# Patient Record
Sex: Female | Born: 1965
Health system: Southern US, Community
[De-identification: ages and names within clinical notes are randomized; demographics above are authoritative.]

---

## 1998-11-01 ENCOUNTER — Ambulatory Visit (HOSPITAL_COMMUNITY): Admission: RE | Admit: 1998-11-01 | Discharge: 1998-11-01 | Payer: Self-pay | Admitting: *Deleted

## 1998-11-06 ENCOUNTER — Inpatient Hospital Stay (HOSPITAL_COMMUNITY): Admission: AD | Admit: 1998-11-06 | Discharge: 1998-11-06 | Payer: Self-pay | Admitting: *Deleted

## 1998-11-21 ENCOUNTER — Emergency Department (HOSPITAL_COMMUNITY): Admission: EM | Admit: 1998-11-21 | Discharge: 1998-11-21 | Payer: Self-pay | Admitting: Internal Medicine

## 1999-01-30 ENCOUNTER — Inpatient Hospital Stay (HOSPITAL_COMMUNITY): Admission: AD | Admit: 1999-01-30 | Discharge: 1999-01-30 | Payer: Self-pay | Admitting: Obstetrics & Gynecology

## 1999-02-04 ENCOUNTER — Encounter (HOSPITAL_COMMUNITY): Admission: RE | Admit: 1999-02-04 | Discharge: 1999-02-11 | Payer: Self-pay | Admitting: *Deleted

## 1999-02-04 ENCOUNTER — Encounter: Payer: Self-pay | Admitting: *Deleted

## 1999-02-07 ENCOUNTER — Inpatient Hospital Stay (HOSPITAL_COMMUNITY): Admission: AD | Admit: 1999-02-07 | Discharge: 1999-02-09 | Payer: Self-pay | Admitting: Obstetrics & Gynecology

## 2001-05-12 ENCOUNTER — Emergency Department (HOSPITAL_COMMUNITY): Admission: EM | Admit: 2001-05-12 | Discharge: 2001-05-12 | Payer: Self-pay | Admitting: Emergency Medicine

## 2002-04-07 ENCOUNTER — Emergency Department (HOSPITAL_COMMUNITY): Admission: EM | Admit: 2002-04-07 | Discharge: 2002-04-07 | Payer: Self-pay | Admitting: Emergency Medicine

## 2003-05-12 ENCOUNTER — Encounter: Payer: Self-pay | Admitting: Emergency Medicine

## 2003-05-12 ENCOUNTER — Emergency Department (HOSPITAL_COMMUNITY): Admission: EM | Admit: 2003-05-12 | Discharge: 2003-05-12 | Payer: Self-pay | Admitting: Emergency Medicine

## 2003-08-23 ENCOUNTER — Encounter: Payer: Self-pay | Admitting: Internal Medicine

## 2003-08-23 ENCOUNTER — Ambulatory Visit (HOSPITAL_COMMUNITY): Admission: RE | Admit: 2003-08-23 | Discharge: 2003-08-23 | Payer: Self-pay | Admitting: Internal Medicine

## 2005-06-23 ENCOUNTER — Ambulatory Visit: Payer: Self-pay | Admitting: Nurse Practitioner

## 2005-12-21 ENCOUNTER — Ambulatory Visit: Payer: Self-pay | Admitting: Nurse Practitioner

## 2007-04-19 ENCOUNTER — Ambulatory Visit: Payer: Self-pay | Admitting: Nurse Practitioner

## 2007-08-31 ENCOUNTER — Emergency Department (HOSPITAL_COMMUNITY): Admission: EM | Admit: 2007-08-31 | Discharge: 2007-08-31 | Payer: Self-pay | Admitting: Emergency Medicine

## 2009-07-18 ENCOUNTER — Emergency Department (HOSPITAL_COMMUNITY): Admission: EM | Admit: 2009-07-18 | Discharge: 2009-07-18 | Payer: Self-pay | Admitting: Emergency Medicine

## 2010-08-07 ENCOUNTER — Emergency Department (HOSPITAL_COMMUNITY): Admission: EM | Admit: 2010-08-07 | Discharge: 2010-08-07 | Payer: Self-pay | Admitting: Emergency Medicine

## 2011-08-26 LAB — URINALYSIS, ROUTINE W REFLEX MICROSCOPIC
Bilirubin Urine: NEGATIVE
Glucose, UA: NEGATIVE
Hgb urine dipstick: NEGATIVE
Nitrite: NEGATIVE
Specific Gravity, Urine: 1.037 — ABNORMAL HIGH
pH: 6

## 2017-03-05 ENCOUNTER — Encounter (HOSPITAL_COMMUNITY): Payer: Self-pay | Admitting: Emergency Medicine

## 2017-03-05 ENCOUNTER — Ambulatory Visit (HOSPITAL_COMMUNITY)
Admission: EM | Admit: 2017-03-05 | Discharge: 2017-03-05 | Disposition: A | Discharge status: Home / Self Care | Payer: 59 | Attending: Internal Medicine | Admitting: Internal Medicine

## 2017-03-05 DIAGNOSIS — R0789 Other chest pain: Secondary | ICD-10-CM | POA: Diagnosis not present

## 2017-03-05 DIAGNOSIS — M779 Enthesopathy, unspecified: Secondary | ICD-10-CM

## 2017-03-05 DIAGNOSIS — M778 Other enthesopathies, not elsewhere classified: Secondary | ICD-10-CM

## 2017-03-05 DIAGNOSIS — M940 Chondrocostal junction syndrome [Tietze]: Secondary | ICD-10-CM

## 2017-03-05 MED ORDER — TRIAMCINOLONE ACETONIDE 40 MG/ML IJ SUSP
40.0000 mg | Freq: Once | INTRAMUSCULAR | Status: AC
  Administered 2017-03-05: 40 mg via INTRAMUSCULAR

## 2017-03-05 MED ORDER — NAPROXEN 250 MG PO TABS: 250.0000 mg | ORAL_TABLET | Freq: Two times a day (BID) | ORAL | 0 refills | Status: AC

## 2017-03-05 MED ORDER — TRIAMCINOLONE ACETONIDE 40 MG/ML IJ SUSP
INTRAMUSCULAR | Status: AC
  Filled 2017-03-05: qty 1

## 2017-03-05 NOTE — ED Provider Notes (Signed)
CSN: [id number]     Arrival date & time 03/05/17  1719 History   First MD Initiated Contact with Patient 03/05/17 1821     Chief Complaint  Patient presents with  . Back Pain   (Consider location/radiation/quality/duration/timing/severity/associated sxs/prior Treatment) 51 year old female complaining of left lateral chest wall pain for 2 months. No history of trauma, fall or other injury. No shortness of breath, cough or fever. She states that it is painful to lay on the left side of her someone to push on the left side.  Also complains of pain to the right middle finger MCP. Primarily tenderness. 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 Denies any known injury.      History reviewed. No pertinent past medical history. History reviewed. No pertinent surgical history. History reviewed. No pertinent family history. Social History  Substance Use Topics  . Smoking status: Never Smoker  . Smokeless tobacco: Never Used  . Alcohol use No   OB History    No data available     Review of Systems  Constitutional: Negative for activity change, fatigue and fever.  HENT: Negative.   Respiratory: Negative for chest tightness, shortness of breath and wheezing.   Cardiovascular: Negative.   Gastrointestinal: Negative.   Musculoskeletal:       As per history of present illness  Skin: Negative.   Neurological: Negative.   All other systems reviewed and are negative.   Allergies  Patient has no known allergies.  Home Medications   Prior to Admission medications   Not on File   Meds Ordered and Administered this Visit   Medications  triamcinolone acetonide (KENALOG-40) injection 40 mg (not administered)    BP (!) 120/108 (BP Location: Left Arm)   Pulse 93   Temp 98.4 F (36.9 C) (Oral)   Resp 18   SpO2 98%  No data found.   Physical Exam  Constitutional: She appears well-developed and well-nourished. No distress.  Eyes: EOM are normal.   Neck: Normal range of motion. Neck supple.  Cardiovascular: Normal rate, regular rhythm, normal heart sounds and intact distal pulses.   Pulmonary/Chest: Effort normal and breath sounds normal. No respiratory distress. She has no wheezes. She has no rales. She exhibits tenderness.  Left chest wall along the mid axillary line there is tenderness from  chest inferior to the axilla to the left lateral costal margin. Tenderness to the ribs and intercostal muscles. Posterior chest wall or back is not tender. No anterior chest wall tenderness or pain.   Abdominal: Soft. She exhibits no mass. There is no tenderness. There is no guarding.  Musculoskeletal: Normal range of motion. She exhibits no deformity.  Tenderness to the right third MCP palmar aspect. No palpable nodules or thickening. Full range of motion of the digits extension and flexion.  Lymphadenopathy:    She has no cervical adenopathy.  Neurological: She is alert. No cranial nerve deficit.  Skin: Skin is warm and dry.  Psychiatric: She has a normal mood and affect. Her behavior is normal.  Nursing note and vitals reviewed.   Urgent Care Course     Procedures (including critical care time)  Labs Review Labs Reviewed - No data to display  Imaging Review No results found.   Visual Acuity Review  Right Eye Distance:   Left Eye Distance:   Bilateral Distance:    Right Eye Near:   Left Eye Near:    Bilateral Near:  MDM   1. Costochondritis   2. Chest wall pain    Place cold packs or ice packs over the left side of your chest. Do this intermittently. Also take the new medicine to help with inflammation and pain. If you develop cough, fever, shortness of breath, worsening or new symptoms go to the emergency department. Otherwise follow-up with your primary care provider. Your blood pressure is elevated and you will need additional evaluation. Meds ordered this encounter  Medications  . triamcinolone acetonide  (KENALOG-40) injection 40 mg  . naproxen (NAPROSYN) 250 MG tablet    Sig: Take 1 tablet (250 mg total) by mouth 2 (two) times daily with a meal.    Dispense:  20 tablet    Refill:  0    Order Specific Question:   Supervising Provider    Answer:   Eustace Moore [[id number]]  finger splint right middle finger     Hayden Rasmussen, NP 03/05/17 1843    Hayden Rasmussen, NP 03/05/17 1915    Hayden Rasmussen, NP 03/05/17 1916

## 2017-03-05 NOTE — Discharge Instructions (Addendum)
Place cold packs or ice packs over the left side of your chest. Do this intermittently. Also take the new medicine to help with inflammation and pain. If you develop cough, fever, shortness of breath, worsening or new symptoms go to the emergency department. Otherwise follow-up with your primary care provider. Your blood pressure is elevated and you will need additional evaluation.

## 2017-03-05 NOTE — ED Triage Notes (Signed)
The patient presented to the [facility] with a complaint of left side back and rib pain x 2 months.

## 2017-03-16 DIAGNOSIS — H5203 Hypermetropia, bilateral: Secondary | ICD-10-CM | POA: Diagnosis not present

## 2017-03-16 DIAGNOSIS — H524 Presbyopia: Secondary | ICD-10-CM | POA: Diagnosis not present

## 2017-03-16 DIAGNOSIS — H52203 Unspecified astigmatism, bilateral: Secondary | ICD-10-CM | POA: Diagnosis not present

## 2017-03-24 DIAGNOSIS — E784 Other hyperlipidemia: Secondary | ICD-10-CM | POA: Diagnosis not present

## 2017-03-24 DIAGNOSIS — Z131 Encounter for screening for diabetes mellitus: Secondary | ICD-10-CM | POA: Diagnosis not present

## 2017-03-24 DIAGNOSIS — K219 Gastro-esophageal reflux disease without esophagitis: Secondary | ICD-10-CM | POA: Diagnosis not present

## 2017-03-24 DIAGNOSIS — K5900 Constipation, unspecified: Secondary | ICD-10-CM | POA: Diagnosis not present

## 2017-03-24 DIAGNOSIS — J302 Other seasonal allergic rhinitis: Secondary | ICD-10-CM | POA: Diagnosis not present

## 2017-03-24 DIAGNOSIS — M545 Low back pain: Secondary | ICD-10-CM | POA: Diagnosis not present

## 2017-06-30 DIAGNOSIS — M79642 Pain in left hand: Secondary | ICD-10-CM | POA: Diagnosis not present

## 2017-06-30 DIAGNOSIS — M25542 Pain in joints of left hand: Secondary | ICD-10-CM | POA: Diagnosis not present

## 2017-10-21 DIAGNOSIS — E7849 Other hyperlipidemia: Secondary | ICD-10-CM | POA: Diagnosis not present

## 2017-10-21 DIAGNOSIS — E559 Vitamin D deficiency, unspecified: Secondary | ICD-10-CM | POA: Diagnosis not present

## 2017-10-21 DIAGNOSIS — E2839 Other primary ovarian failure: Secondary | ICD-10-CM | POA: Diagnosis not present

## 2017-10-21 DIAGNOSIS — J209 Acute bronchitis, unspecified: Secondary | ICD-10-CM | POA: Diagnosis not present

## 2017-10-21 DIAGNOSIS — M545 Low back pain: Secondary | ICD-10-CM | POA: Diagnosis not present

## 2017-10-21 DIAGNOSIS — Z1239 Encounter for other screening for malignant neoplasm of breast: Secondary | ICD-10-CM | POA: Diagnosis not present

## 2017-10-21 DIAGNOSIS — J019 Acute sinusitis, unspecified: Secondary | ICD-10-CM | POA: Diagnosis not present

## 2017-10-28 ENCOUNTER — Other Ambulatory Visit: Payer: Self-pay | Admitting: Internal Medicine

## 2017-10-28 DIAGNOSIS — E2839 Other primary ovarian failure: Secondary | ICD-10-CM

## 2017-11-04 ENCOUNTER — Ambulatory Visit
Admission: RE | Admit: 2017-11-04 | Discharge: 2017-11-04 | Disposition: A | Discharge status: Home / Self Care | Payer: 59 | Source: Ambulatory Visit | Attending: Internal Medicine | Admitting: Internal Medicine

## 2017-11-04 DIAGNOSIS — M8588 Other specified disorders of bone density and structure, other site: Secondary | ICD-10-CM | POA: Diagnosis not present

## 2017-11-04 DIAGNOSIS — Z780 Asymptomatic menopausal state: Secondary | ICD-10-CM | POA: Diagnosis not present

## 2017-11-04 DIAGNOSIS — E2839 Other primary ovarian failure: Secondary | ICD-10-CM

## 2017-11-18 DIAGNOSIS — M899 Disorder of bone, unspecified: Secondary | ICD-10-CM | POA: Diagnosis not present

## 2017-11-18 DIAGNOSIS — E7849 Other hyperlipidemia: Secondary | ICD-10-CM | POA: Diagnosis not present

## 2017-11-18 DIAGNOSIS — Z131 Encounter for screening for diabetes mellitus: Secondary | ICD-10-CM | POA: Diagnosis not present

## 2017-11-18 DIAGNOSIS — H6122 Impacted cerumen, left ear: Secondary | ICD-10-CM | POA: Diagnosis not present

## 2017-11-18 DIAGNOSIS — R42 Dizziness and giddiness: Secondary | ICD-10-CM | POA: Diagnosis not present

## 2017-11-18 DIAGNOSIS — K219 Gastro-esophageal reflux disease without esophagitis: Secondary | ICD-10-CM | POA: Diagnosis not present

## 2017-12-21 DIAGNOSIS — K219 Gastro-esophageal reflux disease without esophagitis: Secondary | ICD-10-CM | POA: Diagnosis not present

## 2017-12-21 DIAGNOSIS — J302 Other seasonal allergic rhinitis: Secondary | ICD-10-CM | POA: Diagnosis not present

## 2017-12-21 DIAGNOSIS — E7849 Other hyperlipidemia: Secondary | ICD-10-CM | POA: Diagnosis not present

## 2018-02-10 ENCOUNTER — Other Ambulatory Visit: Payer: Self-pay

## 2018-02-10 ENCOUNTER — Encounter (HOSPITAL_COMMUNITY): Payer: Self-pay | Admitting: Emergency Medicine

## 2018-02-10 DIAGNOSIS — R1111 Vomiting without nausea: Secondary | ICD-10-CM | POA: Diagnosis not present

## 2018-02-10 DIAGNOSIS — R1013 Epigastric pain: Secondary | ICD-10-CM | POA: Diagnosis not present

## 2018-02-10 DIAGNOSIS — R1031 Right lower quadrant pain: Secondary | ICD-10-CM | POA: Insufficient documentation

## 2018-02-10 DIAGNOSIS — R112 Nausea with vomiting, unspecified: Secondary | ICD-10-CM | POA: Diagnosis not present

## 2018-02-10 DIAGNOSIS — Z79899 Other long term (current) drug therapy: Secondary | ICD-10-CM | POA: Insufficient documentation

## 2018-02-10 LAB — I-STAT TROPONIN, ED: Troponin i, poc: 0 ng/mL (ref 0.00–0.08)

## 2018-02-10 LAB — I-STAT BETA HCG BLOOD, ED (MC, WL, AP ONLY): I-stat hCG, quantitative: <5 m[IU]/mL (ref <5)

## 2018-02-10 NOTE — ED Triage Notes (Signed)
Pt c/o epigastric pain, nausea/vomiting/diarrhea that started at noon today. Denies shortness of breath or urinary symptoms.

## 2018-02-11 ENCOUNTER — Emergency Department (HOSPITAL_COMMUNITY): Payer: 59

## 2018-02-11 ENCOUNTER — Emergency Department (HOSPITAL_COMMUNITY)
Admission: EM | Admit: 2018-02-11 | Discharge: 2018-02-11 | Disposition: A | Discharge status: Home / Self Care | Payer: 59 | Attending: Emergency Medicine | Admitting: Emergency Medicine

## 2018-02-11 DIAGNOSIS — R1110 Vomiting, unspecified: Secondary | ICD-10-CM | POA: Diagnosis not present

## 2018-02-11 DIAGNOSIS — R1013 Epigastric pain: Secondary | ICD-10-CM

## 2018-02-11 DIAGNOSIS — R112 Nausea with vomiting, unspecified: Secondary | ICD-10-CM | POA: Diagnosis not present

## 2018-02-11 DIAGNOSIS — R109 Unspecified abdominal pain: Secondary | ICD-10-CM | POA: Diagnosis not present

## 2018-02-11 DIAGNOSIS — R1111 Vomiting without nausea: Secondary | ICD-10-CM | POA: Diagnosis not present

## 2018-02-11 DIAGNOSIS — Z79899 Other long term (current) drug therapy: Secondary | ICD-10-CM | POA: Diagnosis not present

## 2018-02-11 DIAGNOSIS — R1031 Right lower quadrant pain: Secondary | ICD-10-CM | POA: Diagnosis not present

## 2018-02-11 LAB — CBC
HCT: 41.1 % (ref 36.0–46.0)
Hemoglobin: 13.5 g/dL (ref 12.0–15.0)
MCH: 28 pg (ref 26.0–34.0)
MCHC: 32.8 g/dL (ref 30.0–36.0)
MCV: 85.3 fL (ref 78.0–100.0)
PLATELETS: 308 10*3/uL (ref 150–400)
RBC: 4.82 MIL/uL (ref 3.87–5.11)
RDW: 13.6 % (ref 11.5–15.5)
WBC: 12.8 10*3/uL — ABNORMAL HIGH (ref 4.0–10.5)

## 2018-02-11 LAB — LIPASE, BLOOD: LIPASE: 24 U/L (ref 11–51)

## 2018-02-11 LAB — COMPREHENSIVE METABOLIC PANEL
ALBUMIN: 4.4 g/dL (ref 3.5–5.0)
ALT: 48 U/L (ref 14–54)
AST: 40 U/L (ref 15–41)
Alkaline Phosphatase: 94 U/L (ref 38–126)
Anion gap: 11 (ref 5–15)
BUN: 12 mg/dL (ref 6–20)
CHLORIDE: 103 mmol/L (ref 101–111)
CO2: 21 mmol/L — AB (ref 22–32)
CREATININE: 0.62 mg/dL (ref 0.44–1.00)
Calcium: 9.9 mg/dL (ref 8.9–10.3)
GFR calc Af Amer: >60 mL/min (ref >60)
GFR calc non Af Amer: >60 mL/min (ref >60)
Glucose, Bld: 151 mg/dL — ABNORMAL HIGH (ref 65–99)
POTASSIUM: 3.9 mmol/L (ref 3.5–5.1)
SODIUM: 135 mmol/L (ref 135–145)
Total Bilirubin: 0.7 mg/dL (ref 0.3–1.2)
Total Protein: 8.5 g/dL — ABNORMAL HIGH (ref 6.5–8.1)

## 2018-02-11 LAB — URINALYSIS, ROUTINE W REFLEX MICROSCOPIC
BILIRUBIN URINE: NEGATIVE
Glucose, UA: NEGATIVE mg/dL
HGB URINE DIPSTICK: NEGATIVE
Ketones, ur: NEGATIVE mg/dL
Leukocytes, UA: NEGATIVE
Nitrite: NEGATIVE
PH: 5 (ref 5.0–8.0)
Protein, ur: NEGATIVE mg/dL

## 2018-02-11 MED ORDER — SODIUM CHLORIDE 0.9 % IV BOLUS
1000.0000 mL | Freq: Once | INTRAVENOUS | Status: AC
  Administered 2018-02-11: 1000 mL via INTRAVENOUS

## 2018-02-11 MED ORDER — MORPHINE SULFATE (PF) 4 MG/ML IV SOLN
4.0000 mg | Freq: Once | INTRAVENOUS | Status: AC
  Administered 2018-02-11: 4 mg via INTRAVENOUS
  Filled 2018-02-11: qty 1

## 2018-02-11 MED ORDER — ONDANSETRON HCL 4 MG/2ML IJ SOLN
4.0000 mg | Freq: Once | INTRAMUSCULAR | Status: AC
  Administered 2018-02-11: 4 mg via INTRAVENOUS
  Filled 2018-02-11: qty 2

## 2018-02-11 MED ORDER — ONDANSETRON HCL 4 MG PO TABS: 4.0000 mg | ORAL_TABLET | Freq: Three times a day (TID) | ORAL | 0 refills | Status: AC | PRN

## 2018-02-11 MED ORDER — IOPAMIDOL (ISOVUE-300) INJECTION 61%
INTRAVENOUS | Status: AC
  Administered 2018-02-11: 100 mL
  Filled 2018-02-11: qty 100

## 2018-02-11 NOTE — ED Notes (Signed)
Patient transported to CT 

## 2018-02-11 NOTE — ED Provider Notes (Signed)
[facility] EMERGENCY DEPARTMENT Provider Note   CSN: [id number] Arrival date & time: 02/10/18  2232     History   Chief Complaint Chief Complaint  Patient presents with  . Abdominal Pain    HPI Brenda Zimmerman is a 52 y.o. female.  Pt presents to the ED today with abdominal pain and n/v.  The pt said sx started last night.  She's been in the waiting room all night.  She said her pain is in her epigastrium and her lower abdomen.  Pt denies f/c.  No dysuria.     History reviewed. No pertinent past medical history.  There are no active problems to display for this patient.   History reviewed. No pertinent surgical history.   OB History   None      Home Medications    Prior to Admission medications   Medication Sig Start Date End Date Taking? Authorizing Provider  acetaminophen (TYLENOL) 325 MG tablet Take by mouth as needed for headache.   Yes [provider]  [employer] EAR DROPS 6.5 % OTIC solution Place 4 drops into both ears 2 (two) times daily. 11/18/17  Yes [provider]  [facility] OYSTER SHELL CALCIUM+VIT D 500-125 MG-UNIT TABS Take 1 tablet by mouth daily. 11/18/17  Yes [provider]  naproxen (NAPROSYN) 250 MG tablet Take 1 tablet (250 mg total) by mouth 2 (two) times daily with a meal. 03/05/17  Yes Mabe, David, NP  Vitamin D, Ergocalciferol, (DRISDOL) 50000 units CAPS capsule Take 50,000 Units by mouth once a week. 01/10/18  Yes [provider]  ondansetron (ZOFRAN) 4 MG tablet Take 1 tablet (4 mg total) by mouth every 8 (eight) hours as needed for nausea or vomiting. 02/11/18   Jacalyn Lefevre, MD    Family History No family history on file.  Social History Social History   Tobacco Use  . Smoking status: Never Smoker  . Smokeless tobacco: Never Used  Substance Use Topics  . Alcohol use: No  . Drug use: No     Allergies   Patient has no known allergies.   Review of Systems Review of Systems    Gastrointestinal: Positive for abdominal pain, nausea and vomiting.  All other systems reviewed and are negative.    Physical Exam Updated Vital Signs BP 119/80   Pulse (!) 108   Temp 98.7 F (37.1 C) (Oral)   Resp 18   SpO2 96%   Physical Exam  Constitutional: She is oriented to person, place, and time. She appears well-developed and well-nourished.  HENT:  Head: Normocephalic and atraumatic.  Mouth/Throat: Oropharynx is clear and moist.  Eyes: Pupils are equal, round, and reactive to light. EOM are normal.  Cardiovascular: Regular rhythm, normal heart sounds and intact distal pulses. Tachycardia present.  Pulmonary/Chest: Effort normal and breath sounds normal.  Abdominal: Normal appearance and bowel sounds are normal. There is tenderness in the right lower quadrant and epigastric area.  Neurological: She is alert and oriented to person, place, and time.  Skin: Skin is warm and dry. Capillary refill takes less than 2 seconds.  Psychiatric: She has a normal mood and affect. Her behavior is normal.  Nursing note and vitals reviewed.    ED Treatments / Results  Labs (all labs ordered are listed, but only abnormal results are displayed) Labs Reviewed  COMPREHENSIVE METABOLIC PANEL - Abnormal; Notable for the following components:      Result Value   CO2 21 (*)    Glucose,  Bld 151 (*)    Total Protein 8.5 (*)    All other components within normal limits  CBC - Abnormal; Notable for the following components:   WBC 12.8 (*)    All other components within normal limits  URINALYSIS, ROUTINE W REFLEX MICROSCOPIC - Abnormal; Notable for the following components:   Specific Gravity, Urine >1.046 (*)    All other components within normal limits  LIPASE, BLOOD  I-STAT BETA HCG BLOOD, ED ([facility], [facility], [facility] ONLY)  I-STAT TROPONIN, ED    EKG None  Radiology Ct Abdomen Pelvis W Contrast  Result Date: 02/11/2018 CLINICAL DATA:  Diffuse abdominal pain with nausea and vomiting. EXAM:  CT ABDOMEN AND PELVIS WITH CONTRAST TECHNIQUE: Multidetector CT imaging of the abdomen and pelvis was performed using the standard protocol following bolus administration of intravenous contrast. CONTRAST:  100mL ISOVUE-300 IOPAMIDOL (ISOVUE-300) INJECTION 61% COMPARISON:  None. FINDINGS: Evaluation of the upper abdomen is limited due to motion artifact. Lower chest: No acute abnormality. Hepatobiliary: No focal liver abnormality is seen. No gallstones, gallbladder wall thickening, or biliary dilatation. Pancreas: Unremarkable. No pancreatic ductal dilatation or surrounding inflammatory changes. Spleen: Normal in size without focal abnormality. Adrenals/Urinary Tract: Adrenal glands are unremarkable. Kidneys are normal, without renal calculi, focal lesion, or hydronephrosis. Bladder is unremarkable. Stomach/Bowel: Stomach is within normal limits. The appendix is visualized in the right lower quadrant just inferior to the cecum. There is mild mucosal hyperenhancement and fluid throughout the appendix. No appendicolith. The appendix is at the upper limits of normal in size, measuring 7 mm in maximal diameter. There are no significant surrounding inflammatory changes. No evidence of bowel wall thickening or distention. Vascular/Lymphatic: No significant vascular findings are present. No enlarged abdominal or pelvic lymph nodes. Reproductive: Uterus and bilateral adnexa are unremarkable. Other: No abdominal wall hernia or abnormality. No abdominopelvic ascites. No pneumoperitoneum. Musculoskeletal: No acute or significant osseous findings. IMPRESSION: 1. Mild mucosal enhancement of the fluid-filled appendix, at the upper limits of normal in diameter, measuring 7 mm. No prominent surrounding inflammatory changes. Early appendicitis is not excluded. Clinical correlation is recommended. Electronically Signed   By: Obie DredgeWilliam T Derry M.D.   On: 02/11/2018 11:10   [place] Abdomen Limited  Result Date: 02/11/2018 CLINICAL DATA:   Epigastric pain over the last day. EXAM: ULTRASOUND ABDOMEN LIMITED RIGHT UPPER QUADRANT COMPARISON:  CT same day FINDINGS: Gallbladder: No gallstones or wall thickening visualized. No sonographic Murphy sign noted by sonographer. Common bile duct: Diameter: 3 mm common normal Liver: No focal lesion identified. Within normal limits in parenchymal echogenicity. Portal vein is patent on color Doppler imaging with normal direction of blood flow towards the liver. IMPRESSION: Normal right upper quadrant ultrasound. Electronically Signed   By: Paulina FusiMark  Shogry M.D.   On: 02/11/2018 14:26    Procedures Procedures (including critical care time)  Medications Ordered in ED Medications  sodium chloride 0.9 % bolus 1,000 mL (0 mLs Intravenous Stopped 02/11/18 1255)  ondansetron (ZOFRAN) injection 4 mg (4 mg Intravenous Given 02/11/18 0916)  morphine 4 MG/ML injection 4 mg (4 mg Intravenous Given 02/11/18 0916)  iopamidol (ISOVUE-300) 61 % injection (100 mLs  Contrast Given 02/11/18 1018)     Initial Impression / Assessment and Plan / ED Course  I have reviewed the triage vital signs and the nursing notes.  Pertinent labs & imaging results that were available during my care of the patient were reviewed by me and considered in my medical decision making (see chart for details).  Pt is feeling much better.  She is able to tolerate po fluids.  Return if worse.  Final Clinical Impressions(s) / ED Diagnoses   Final diagnoses:  Epigastric pain  Non-intractable vomiting with nausea, unspecified vomiting type    ED Discharge Orders        Ordered    ondansetron (ZOFRAN) 4 MG tablet  Every 8 hours PRN     02/11/18 1439       Jacalyn Lefevre, MD 02/11/18 1439

## 2018-02-11 NOTE — Consult Note (Signed)
[facility] Consult Note  Brenda Zimmerman 02-Mar-1966  [id number].    Requesting MD: Isla Pence Chief Complaint/Reason for Consult: abdominal pain  HPI:  Brenda Zimmerman is a 52yo female who presented to the [facility] last night with acute onset epigastric abdominal pain. It does not radiate. Patient states that the pain started around 1600 yesterday. Associated with nausea, vomiting, diarrhea. Denies fever, chills, CP, SOB, dysuria.  States that she has never had pain like this before. Unsure if she has had any recent sick contacts, but she does work in the hospital. Last meal was yesterday at 1100. In the ED she had a CT scan which shows mild mucosal enhancement of the fluid-filled appendix, at the upper limits of normal in diameter measuring 7 mm, no prominent surrounding inflammatory changes. WBC 12.8, tachycardic at 108. LFTs and lipase WNL. General surgery asked to see.  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   HENT: Negative.   Eyes: Negative.   Respiratory: Negative.   Cardiovascular: Negative.   Gastrointestinal: Positive for abdominal pain, diarrhea, nausea and vomiting.  Genitourinary: Negative.   Musculoskeletal: Negative.   Skin: Negative.   Neurological: Negative.    All systems reviewed and otherwise negative except for as above  No family history on file.  History reviewed. No pertinent past medical history.  History reviewed. No pertinent surgical history.  Social History:  reports that she has never smoked. She has never used smokeless tobacco. She reports that she does not drink alcohol or use drugs.  Allergies: No Known Allergies   (Not in a hospital admission)  Prior to Admission medications   Medication Sig Start Date End Date Taking? Authorizing Provider  acetaminophen (TYLENOL) 325 MG  tablet Take by mouth as needed for headache.   Yes [provider]  [employer] EAR DROPS 6.5 % OTIC solution Place 4 drops into both ears 2 (two) times daily. 11/18/17  Yes [provider]  [facility]S OYSTER SHELL CALCIUM+VIT D 500-125 MG-UNIT TABS Take 1 tablet by mouth daily. 11/18/17  Yes [provider]  naproxen (NAPROSYN) 250 MG tablet Take 1 tablet (250 mg total) by mouth 2 (two) times daily with a meal. 03/05/17  Yes Mabe, David, NP  Vitamin D, Ergocalciferol, (DRISDOL) 50000 units CAPS capsule Take 50,000 Units by mouth once a week. 01/10/18  Yes [provider]    Blood pressure 119/80, pulse (!) 108, temperature 98.7 F (37.1 C), temperature source Oral, resp. rate 18, SpO2 96 %. Physical Exam: General: pleasant, WD/WN female who is laying in bed in NAD HEENT: head is normocephalic, atraumatic.  Sclera are noninjected.  Pupils equal and round.  Ears and nose without any masses or lesions.  Mouth is pink and moist. Dentition fair Heart: regular, rate, and rhythm.  No obvious murmurs, gallops, or rubs noted.  Palpable pedal pulses bilaterally Lungs: CTAB, no wheezes, rhonchi, or rales noted.  Respiratory effort nonlabored Abd: soft, ND, +BS, no masses, hernias, or organomegaly. +TTP epigastric region, no RLQ or LLQ tenderness MS: all 4 extremities are symmetrical with no cyanosis, clubbing, or edema. Skin: warm and dry with no masses, lesions, or rashes Psych: A&Ox3 with an appropriate affect. Neuro: cranial nerves grossly intact, extremity CSM intact bilaterally, normal speech  Results for orders placed or performed during the hospital encounter of 02/11/18 (from the past 48 hour(s))  Lipase, blood  Status: None   Collection Time: 02/10/18 11:04 PM  Result Value Ref Range   Lipase 24 11 - 51 U/L    Comment: Performed at [facility], [place] [address], [place], [place] [address]  Comprehensive metabolic panel     Status: Abnormal   Collection Time: 02/10/18  11:04 PM  Result Value Ref Range   Sodium 135 135 - 145 mmol/L   Potassium 3.9 3.5 - 5.1 mmol/L   Chloride 103 101 - 111 mmol/L   CO2 21 (L) 22 - 32 mmol/L   Glucose, Bld 151 (H) 65 - 99 mg/dL   BUN 12 6 - 20 mg/dL   Creatinine, Ser 0.62 0.44 - 1.00 mg/dL   Calcium 9.9 8.9 - 10.3 mg/dL   Total Protein 8.5 (H) 6.5 - 8.1 g/dL   Albumin 4.4 3.5 - 5.0 g/dL   AST 40 15 - 41 U/L   ALT 48 14 - 54 U/L   Alkaline Phosphatase 94 38 - 126 U/L   Total Bilirubin 0.7 0.3 - 1.2 mg/dL   GFR calc non Af Amer >60 >60 mL/min   GFR calc Af Amer >60 >60 mL/min    Comment: (NOTE) The eGFR has been calculated using the CKD EPI equation. This calculation has not been validated in all clinical situations. eGFR's persistently <60 mL/min signify possible Chronic Kidney Disease.    Anion gap 11 5 - 15    Comment: Performed at [facility] [address] [place], [place] [address]  CBC     Status: Abnormal   Collection Time: 02/10/18 11:04 PM  Result Value Ref Range   WBC 12.8 (H) 4.0 - 10.5 K/uL   RBC 4.82 3.87 - 5.11 MIL/uL   Hemoglobin 13.5 12.0 - 15.0 g/dL   HCT 41.1 36.0 - 46.0 %   MCV 85.3 78.0 - 100.0 fL   MCH 28.0 26.0 - 34.0 pg   MCHC 32.8 30.0 - 36.0 g/dL   RDW 13.6 11.5 - 15.5 %   Platelets 308 150 - 400 K/uL    Comment: Performed at [facility] [address], [place], [place] [address]  I-Stat beta hCG blood, ED     Status: None   Collection Time: 02/10/18 11:27 PM  Result Value Ref Range   I-stat hCG, quantitative <5.0 <5 mIU/mL   Comment 3            Comment:   GEST. AGE      CONC.  (mIU/mL)   <=1 WEEK        5 - 50     2 WEEKS       50 - 500     3 WEEKS       100 - 10,000     4 WEEKS     1,000 - 30,000        FEMALE AND NON-PREGNANT FEMALE:     LESS THAN 5 mIU/mL   I-stat troponin, ED     Status: None   Collection Time: 02/10/18 11:27 PM  Result Value Ref Range   Troponin i, poc 0.00 0.00 - 0.08 ng/mL   Comment 3            Comment: Due to the release  kinetics of cTnI, a negative result within the first hours of the onset of symptoms does not rule out myocardial infarction with certainty. If myocardial infarction is still suspected, repeat the test at appropriate intervals.    Ct  Abdomen Pelvis W Contrast  Result Date: 02/11/2018 CLINICAL DATA:  Diffuse abdominal pain with nausea and vomiting. EXAM: CT ABDOMEN AND PELVIS WITH CONTRAST TECHNIQUE: Multidetector CT imaging of the abdomen and pelvis was performed using the standard protocol following bolus administration of intravenous contrast. CONTRAST:  180m ISOVUE-300 IOPAMIDOL (ISOVUE-300) INJECTION 61% COMPARISON:  None. FINDINGS: Evaluation of the upper abdomen is limited due to motion artifact. Lower chest: No acute abnormality. Hepatobiliary: No focal liver abnormality is seen. No gallstones, gallbladder wall thickening, or biliary dilatation. Pancreas: Unremarkable. No pancreatic ductal dilatation or surrounding inflammatory changes. Spleen: Normal in size without focal abnormality. Adrenals/Urinary Tract: Adrenal glands are unremarkable. Kidneys are normal, without renal calculi, focal lesion, or hydronephrosis. Bladder is unremarkable. Stomach/Bowel: Stomach is within normal limits. The appendix is visualized in the right lower quadrant just inferior to the cecum. There is mild mucosal hyperenhancement and fluid throughout the appendix. No appendicolith. The appendix is at the upper limits of normal in size, measuring 7 mm in maximal diameter. There are no significant surrounding inflammatory changes. No evidence of bowel wall thickening or distention. Vascular/Lymphatic: No significant vascular findings are present. No enlarged abdominal or pelvic lymph nodes. Reproductive: Uterus and bilateral adnexa are unremarkable. Other: No abdominal wall hernia or abnormality. No abdominopelvic ascites. No pneumoperitoneum. Musculoskeletal: No acute or significant osseous findings. IMPRESSION: 1. Mild  mucosal enhancement of the fluid-filled appendix, at the upper limits of normal in diameter, measuring 7 mm. No prominent surrounding inflammatory changes. Early appendicitis is not excluded. Clinical correlation is recommended. Electronically Signed   By: WTitus DubinM.D.   On: 02/11/2018 11:10   Anti-infectives (From admission, onward)   None        Assessment/Plan Epigastric abdominal pain, nausea, vomiting, diarrhea - Patient with acute onset epigastric pain. CT scan with possible findings of early appendicitis, but she has no periumbilical or RLQ tenderness. WBC slightly elevated at 12.8 and she is mildly tachycardic. Lipase and LFTs WNL. Will check abdominal u/s to evaluate her gallbladder. Also included in the differential diagnosis is viral gastroenteritis. Will obtain u/s and discuss with MD.   BWellington Hampshire PChrist[facility] 02/11/2018, 1:05 PM Pager: [phone]: [phone]-Fri 7:00 am-4:30 pm Sat-Sun 7:00 am-11:30 am

## 2018-02-17 DIAGNOSIS — M545 Low back pain: Secondary | ICD-10-CM | POA: Diagnosis not present

## 2018-02-17 DIAGNOSIS — Z1211 Encounter for screening for malignant neoplasm of colon: Secondary | ICD-10-CM | POA: Diagnosis not present

## 2018-02-17 DIAGNOSIS — K219 Gastro-esophageal reflux disease without esophagitis: Secondary | ICD-10-CM | POA: Diagnosis not present

## 2018-02-17 DIAGNOSIS — J302 Other seasonal allergic rhinitis: Secondary | ICD-10-CM | POA: Diagnosis not present

## 2018-02-17 DIAGNOSIS — E7849 Other hyperlipidemia: Secondary | ICD-10-CM | POA: Diagnosis not present

## 2018-02-17 DIAGNOSIS — Z124 Encounter for screening for malignant neoplasm of cervix: Secondary | ICD-10-CM | POA: Diagnosis not present

## 2018-03-29 ENCOUNTER — Encounter: Payer: Self-pay | Admitting: Internal Medicine

## 2018-05-10 DIAGNOSIS — J302 Other seasonal allergic rhinitis: Secondary | ICD-10-CM | POA: Diagnosis not present

## 2018-05-10 DIAGNOSIS — E559 Vitamin D deficiency, unspecified: Secondary | ICD-10-CM | POA: Diagnosis not present

## 2018-05-10 DIAGNOSIS — L259 Unspecified contact dermatitis, unspecified cause: Secondary | ICD-10-CM | POA: Diagnosis not present

## 2018-05-10 DIAGNOSIS — Z1211 Encounter for screening for malignant neoplasm of colon: Secondary | ICD-10-CM | POA: Diagnosis not present

## 2018-05-10 DIAGNOSIS — K219 Gastro-esophageal reflux disease without esophagitis: Secondary | ICD-10-CM | POA: Diagnosis not present

## 2018-05-10 DIAGNOSIS — E7849 Other hyperlipidemia: Secondary | ICD-10-CM | POA: Diagnosis not present

## 2018-05-10 DIAGNOSIS — Z124 Encounter for screening for malignant neoplasm of cervix: Secondary | ICD-10-CM | POA: Diagnosis not present

## 2018-06-02 ENCOUNTER — Ambulatory Visit (INDEPENDENT_AMBULATORY_CARE_PROVIDER_SITE_OTHER): Payer: 59 | Admitting: Obstetrics

## 2018-06-02 ENCOUNTER — Encounter: Payer: Self-pay | Admitting: Obstetrics

## 2018-06-02 VITALS — BP 130/83 | HR 93 | Ht 60.0 in | Wt 149.3 lb

## 2018-06-02 DIAGNOSIS — Z1239 Encounter for other screening for malignant neoplasm of breast: Secondary | ICD-10-CM

## 2018-06-02 DIAGNOSIS — N898 Other specified noninflammatory disorders of vagina: Secondary | ICD-10-CM

## 2018-06-02 DIAGNOSIS — Z01419 Encounter for gynecological examination (general) (routine) without abnormal findings: Secondary | ICD-10-CM

## 2018-06-02 DIAGNOSIS — Z780 Asymptomatic menopausal state: Secondary | ICD-10-CM

## 2018-06-02 DIAGNOSIS — Z1151 Encounter for screening for human papillomavirus (HPV): Secondary | ICD-10-CM | POA: Diagnosis not present

## 2018-06-02 DIAGNOSIS — Z1231 Encounter for screening mammogram for malignant neoplasm of breast: Secondary | ICD-10-CM | POA: Diagnosis not present

## 2018-06-02 DIAGNOSIS — Z124 Encounter for screening for malignant neoplasm of cervix: Secondary | ICD-10-CM | POA: Diagnosis not present

## 2018-06-02 NOTE — Progress Notes (Signed)
Subjective:        Brenda Zimmerman is a 52 y.o. female here for a routine exam.  Current complaints: None.    Personal health questionnaire:  Is patient Ashkenazi Jewish, have a family history of breast and/or ovarian cancer: no Is there a family history of uterine cancer diagnosed at age < 9550, gastrointestinal cancer, urinary tract cancer, family member who is a Personnel officerLynch syndrome-associated carrier: no Is the patient overweight and hypertensive, family history of diabetes, personal history of gestational diabetes, preeclampsia or PCOS: no Is patient over 6655, have PCOS,  family history of premature CHD under age 54, diabetes, smoke, have hypertension or peripheral artery disease:  no At any time, has a partner hit, kicked or otherwise hurt or frightened you?: no Over the past 2 weeks, have you felt down, depressed or hopeless?: no Over the past 2 weeks, have you felt little interest or pleasure in doing things?:no   Gynecologic History No LMP recorded. Patient is postmenopausal. Contraception: post menopausal status Last Pap: unknown. Results were: unknown Last mammogram: none. Results were: none  Obstetric History OB History  Gravida Para Term Preterm AB Living  5         5  SAB TAB Ectopic Multiple Live Births               # Outcome Date GA Lbr Len/2nd Weight Sex Delivery Anes PTL Lv  5 Gravida           4 Gravida           3 Gravida           2 Gravida           1 [place]Gravida             History reviewed. No pertinent past medical history.  History reviewed. No pertinent surgical history.   Current Outpatient Medications:  .  triamcinolone cream (KENALOG) 0.1 %, APPLY TO AFFECTED AREA TWICE A DAY AS NEEDED, Disp: , Rfl: 2 .  Vitamin D, Ergocalciferol, (DRISDOL) 50000 units CAPS capsule, Take 50,000 Units by mouth once a week., Disp: , Rfl: 5 .  acetaminophen (TYLENOL) 325 MG tablet, Take by mouth as needed for headache., Disp: , Rfl:  .  [employer] EAR DROPS 6.5 % OTIC solution,  Place 4 drops into both ears 2 (two) times daily., Disp: , Rfl: 0 .  CVS OYSTER SHELL CALCIUM+VIT D 500-125 MG-UNIT TABS, Take 1 tablet by mouth daily., Disp: , Rfl: 2 .  naproxen (NAPROSYN) 250 MG tablet, Take 1 tablet (250 mg total) by mouth 2 (two) times daily with a meal. (Patient not taking: Reported on 06/02/2018), Disp: 20 tablet, Rfl: 0 .  omeprazole (PRILOSEC) 20 MG capsule, Take 20 mg by mouth 2 (two) times daily., Disp: , Rfl: 2 .  ondansetron (ZOFRAN) 4 MG tablet, Take 1 tablet (4 mg total) by mouth every 8 (eight) hours as needed for nausea or vomiting. (Patient not taking: Reported on 06/02/2018), Disp: 4 tablet, Rfl: 0 .  pantoprazole (PROTONIX) 40 MG tablet, TAKE 1 TABLET BY MOUTH EVERY DAY IN THE MORNING, Disp: , Rfl: 2 No Known Allergies  Social History   Tobacco Use  . Smoking status: Never Smoker  . Smokeless tobacco: Never Used  Substance Use Topics  . Alcohol use: No    History reviewed. No pertinent family history.    Review of Systems  Constitutional: negative for fatigue and weight loss Respiratory: negative for cough and wheezing Cardiovascular:  negative for chest pain, fatigue and palpitations Gastrointestinal: negative for abdominal pain and change in bowel habits Musculoskeletal:negative for myalgias Neurological: negative for gait problems and tremors Behavioral/Psych: negative for abusive relationship, depression Endocrine: negative for temperature intolerance    Genitourinary:negative for abnormal menstrual periods, genital lesions, hot flashes, sexual problems and vaginal discharge Integument/breast: negative for breast lump, breast tenderness, nipple discharge and skin lesion(s)    Objective:       BP 130/83   Pulse 93   Ht 5' (1.524 m)   Wt 149 lb 4.8 oz (67.7 kg)   BMI 29.16 kg/m  General:   alert  Skin:   no rash or abnormalities  Lungs:   clear to auscultation bilaterally  Heart:   regular rate and rhythm, S1, S2 normal, no murmur,  click, rub or gallop  Breasts:   normal without suspicious masses, skin or nipple changes or axillary nodes  Abdomen:  normal findings: no organomegaly, soft, non-tender and no hernia  Pelvis:  External genitalia: normal general appearance Urinary system: urethral meatus normal and bladder without fullness, nontender Vaginal: normal without tenderness, induration or masses Cervix: normal appearance Adnexa: normal bimanual exam Uterus: anteverted and non-tender, normal size   Lab Review Urine pregnancy test Labs reviewed yes Radiologic studies reviewed no  50% of 20 min visit spent on counseling and coordination of care.   Assessment:     1. Encounter for routine gynecological examination with Papanicolaou smear of cervix - doing well  2. Screening breast examination Rx: - MM Digital Screening; Future  3. Menopause Rx: - DG BONE DENSITY (DXA); Future   Plan:    Education reviewed: calcium supplements, depression evaluation, low fat, low cholesterol diet, self breast exams and weight bearing exercise. Mammogram ordered. Follow up in: 1 year.   No orders of the defined types were placed in this encounter.  Orders Placed This Encounter  Procedures  . MM Digital Screening    Standing Status:   Future    Standing Expiration Date:   08/04/2019    Order Specific Question:   Reason for Exam (SYMPTOM  OR DIAGNOSIS REQUIRED)    Answer:   Screening    Order Specific Question:   Is the patient pregnant?    Answer:   No    Order Specific Question:   Preferred imaging location?    Answer:   [facility]  . DG BONE DENSITY (DXA)    Standing Status:   Future    Standing Expiration Date:   08/04/2019    Order Specific Question:   Reason for Exam (SYMPTOM  OR DIAGNOSIS REQUIRED)    Answer:   Screening    Order Specific Question:   Is the patient pregnant?    Answer:   No    Order Specific Question:   Preferred imaging location?    Answer:   [facility] Julio Alm MD 06-02-2018

## 2018-06-02 NOTE — Progress Notes (Signed)
NGYN patient presents for Annual Exam   LMP: Menopause  Last pap: N/A Mammogram:never  Colonoscopy: Never  STD Screening : Declined   CC: no complaints today

## 2018-06-03 NOTE — Addendum Note (Signed)
Addended by: Tim LairLARK, Gladys Deckard on: 06/03/2018 11:34 AM   Modules accepted: Orders

## 2018-06-07 LAB — CERVICOVAGINAL ANCILLARY ONLY
BACTERIAL VAGINITIS: NEGATIVE
CANDIDA VAGINITIS: NEGATIVE

## 2018-06-08 LAB — CYTOLOGY - PAP
DIAGNOSIS: NEGATIVE
HPV (WINDOPATH): NOT DETECTED

## 2018-07-05 ENCOUNTER — Inpatient Hospital Stay: Admission: RE | Admit: 2018-07-05 | Payer: 59 | Source: Ambulatory Visit

## 2018-07-07 ENCOUNTER — Encounter: Payer: Self-pay | Admitting: Internal Medicine

## 2018-08-25 DIAGNOSIS — H25013 Cortical age-related cataract, bilateral: Secondary | ICD-10-CM | POA: Diagnosis not present

## 2018-08-25 DIAGNOSIS — H5203 Hypermetropia, bilateral: Secondary | ICD-10-CM | POA: Diagnosis not present

## 2018-08-25 DIAGNOSIS — H524 Presbyopia: Secondary | ICD-10-CM | POA: Diagnosis not present

## 2018-08-25 DIAGNOSIS — H52203 Unspecified astigmatism, bilateral: Secondary | ICD-10-CM | POA: Diagnosis not present

## 2018-08-25 DIAGNOSIS — H2513 Age-related nuclear cataract, bilateral: Secondary | ICD-10-CM | POA: Diagnosis not present

## 2018-09-13 DIAGNOSIS — K219 Gastro-esophageal reflux disease without esophagitis: Secondary | ICD-10-CM | POA: Diagnosis not present

## 2018-09-13 DIAGNOSIS — E7849 Other hyperlipidemia: Secondary | ICD-10-CM | POA: Diagnosis not present

## 2018-09-13 DIAGNOSIS — J302 Other seasonal allergic rhinitis: Secondary | ICD-10-CM | POA: Diagnosis not present

## 2018-09-13 DIAGNOSIS — N390 Urinary tract infection, site not specified: Secondary | ICD-10-CM | POA: Diagnosis not present

## 2018-09-13 DIAGNOSIS — M545 Low back pain: Secondary | ICD-10-CM | POA: Diagnosis not present

## 2018-12-15 DIAGNOSIS — M545 Low back pain: Secondary | ICD-10-CM | POA: Diagnosis not present

## 2018-12-15 DIAGNOSIS — K219 Gastro-esophageal reflux disease without esophagitis: Secondary | ICD-10-CM | POA: Diagnosis not present

## 2018-12-15 DIAGNOSIS — E7849 Other hyperlipidemia: Secondary | ICD-10-CM | POA: Diagnosis not present

## 2018-12-15 DIAGNOSIS — J302 Other seasonal allergic rhinitis: Secondary | ICD-10-CM | POA: Diagnosis not present

## 2018-12-15 DIAGNOSIS — Z131 Encounter for screening for diabetes mellitus: Secondary | ICD-10-CM | POA: Diagnosis not present

## 2019-01-17 DIAGNOSIS — E7849 Other hyperlipidemia: Secondary | ICD-10-CM | POA: Diagnosis not present

## 2019-01-17 DIAGNOSIS — K219 Gastro-esophageal reflux disease without esophagitis: Secondary | ICD-10-CM | POA: Diagnosis not present

## 2019-01-17 DIAGNOSIS — K5900 Constipation, unspecified: Secondary | ICD-10-CM | POA: Diagnosis not present

## 2019-01-17 DIAGNOSIS — M545 Low back pain: Secondary | ICD-10-CM | POA: Diagnosis not present

## 2019-02-28 DIAGNOSIS — J302 Other seasonal allergic rhinitis: Secondary | ICD-10-CM | POA: Diagnosis not present

## 2019-02-28 DIAGNOSIS — Z1239 Encounter for other screening for malignant neoplasm of breast: Secondary | ICD-10-CM | POA: Diagnosis not present

## 2019-02-28 DIAGNOSIS — E7849 Other hyperlipidemia: Secondary | ICD-10-CM | POA: Diagnosis not present

## 2019-02-28 DIAGNOSIS — M545 Low back pain: Secondary | ICD-10-CM | POA: Diagnosis not present

## 2019-02-28 DIAGNOSIS — K219 Gastro-esophageal reflux disease without esophagitis: Secondary | ICD-10-CM | POA: Diagnosis not present

## 2019-10-30 DIAGNOSIS — J302 Other seasonal allergic rhinitis: Secondary | ICD-10-CM | POA: Diagnosis not present

## 2019-10-30 DIAGNOSIS — E7849 Other hyperlipidemia: Secondary | ICD-10-CM | POA: Diagnosis not present

## 2019-10-30 DIAGNOSIS — M545 Low back pain: Secondary | ICD-10-CM | POA: Diagnosis not present

## 2019-10-30 DIAGNOSIS — K219 Gastro-esophageal reflux disease without esophagitis: Secondary | ICD-10-CM | POA: Diagnosis not present

## 2019-12-12 DIAGNOSIS — E559 Vitamin D deficiency, unspecified: Secondary | ICD-10-CM | POA: Diagnosis not present

## 2019-12-12 DIAGNOSIS — J302 Other seasonal allergic rhinitis: Secondary | ICD-10-CM | POA: Diagnosis not present

## 2019-12-12 DIAGNOSIS — G44209 Tension-type headache, unspecified, not intractable: Secondary | ICD-10-CM | POA: Diagnosis not present

## 2019-12-12 DIAGNOSIS — Z1239 Encounter for other screening for malignant neoplasm of breast: Secondary | ICD-10-CM | POA: Diagnosis not present

## 2019-12-12 DIAGNOSIS — E7849 Other hyperlipidemia: Secondary | ICD-10-CM | POA: Diagnosis not present

## 2019-12-12 DIAGNOSIS — K219 Gastro-esophageal reflux disease without esophagitis: Secondary | ICD-10-CM | POA: Diagnosis not present

## 2019-12-12 DIAGNOSIS — Z0000 Encounter for general adult medical examination without abnormal findings: Secondary | ICD-10-CM | POA: Diagnosis not present

## 2019-12-12 DIAGNOSIS — Z131 Encounter for screening for diabetes mellitus: Secondary | ICD-10-CM | POA: Diagnosis not present

## 2019-12-12 DIAGNOSIS — E2839 Other primary ovarian failure: Secondary | ICD-10-CM | POA: Diagnosis not present

## 2019-12-28 ENCOUNTER — Other Ambulatory Visit: Payer: Self-pay | Admitting: Internal Medicine

## 2019-12-28 DIAGNOSIS — E2839 Other primary ovarian failure: Secondary | ICD-10-CM

## 2020-06-11 DIAGNOSIS — M545 Low back pain: Secondary | ICD-10-CM | POA: Diagnosis not present

## 2020-06-11 DIAGNOSIS — E7849 Other hyperlipidemia: Secondary | ICD-10-CM | POA: Diagnosis not present

## 2020-06-11 DIAGNOSIS — J302 Other seasonal allergic rhinitis: Secondary | ICD-10-CM | POA: Diagnosis not present

## 2020-06-11 DIAGNOSIS — K219 Gastro-esophageal reflux disease without esophagitis: Secondary | ICD-10-CM | POA: Diagnosis not present

## 2020-08-20 DIAGNOSIS — H2513 Age-related nuclear cataract, bilateral: Secondary | ICD-10-CM | POA: Diagnosis not present

## 2020-08-20 DIAGNOSIS — H5203 Hypermetropia, bilateral: Secondary | ICD-10-CM | POA: Diagnosis not present

## 2020-08-20 DIAGNOSIS — H524 Presbyopia: Secondary | ICD-10-CM | POA: Diagnosis not present

## 2020-08-20 DIAGNOSIS — H52203 Unspecified astigmatism, bilateral: Secondary | ICD-10-CM | POA: Diagnosis not present

## 2020-08-29 DIAGNOSIS — Z23 Encounter for immunization: Secondary | ICD-10-CM | POA: Diagnosis not present

## 2020-08-29 DIAGNOSIS — E7849 Other hyperlipidemia: Secondary | ICD-10-CM | POA: Diagnosis not present

## 2020-08-29 DIAGNOSIS — M5459 Other low back pain: Secondary | ICD-10-CM | POA: Diagnosis not present

## 2020-08-29 DIAGNOSIS — N390 Urinary tract infection, site not specified: Secondary | ICD-10-CM | POA: Diagnosis not present

## 2020-08-29 DIAGNOSIS — K219 Gastro-esophageal reflux disease without esophagitis: Secondary | ICD-10-CM | POA: Diagnosis not present

## 2020-10-08 DIAGNOSIS — K219 Gastro-esophageal reflux disease without esophagitis: Secondary | ICD-10-CM | POA: Diagnosis not present

## 2020-10-08 DIAGNOSIS — M5459 Other low back pain: Secondary | ICD-10-CM | POA: Diagnosis not present

## 2020-10-08 DIAGNOSIS — J302 Other seasonal allergic rhinitis: Secondary | ICD-10-CM | POA: Diagnosis not present

## 2020-10-08 DIAGNOSIS — G44209 Tension-type headache, unspecified, not intractable: Secondary | ICD-10-CM | POA: Diagnosis not present

## 2020-10-24 DIAGNOSIS — E7849 Other hyperlipidemia: Secondary | ICD-10-CM | POA: Diagnosis not present

## 2020-10-24 DIAGNOSIS — G44209 Tension-type headache, unspecified, not intractable: Secondary | ICD-10-CM | POA: Diagnosis not present

## 2020-10-24 DIAGNOSIS — K219 Gastro-esophageal reflux disease without esophagitis: Secondary | ICD-10-CM | POA: Diagnosis not present

## 2020-10-24 DIAGNOSIS — E559 Vitamin D deficiency, unspecified: Secondary | ICD-10-CM | POA: Diagnosis not present

## 2020-11-04 ENCOUNTER — Emergency Department (HOSPITAL_COMMUNITY): Payer: PRIVATE HEALTH INSURANCE

## 2020-11-04 ENCOUNTER — Emergency Department (HOSPITAL_COMMUNITY)
Admission: EM | Admit: 2020-11-04 | Discharge: 2020-11-04 | Disposition: A | Discharge status: Home / Self Care | Payer: PRIVATE HEALTH INSURANCE | Attending: Emergency Medicine | Admitting: Emergency Medicine

## 2020-11-04 ENCOUNTER — Other Ambulatory Visit: Payer: Self-pay

## 2020-11-04 DIAGNOSIS — Z23 Encounter for immunization: Secondary | ICD-10-CM | POA: Diagnosis not present

## 2020-11-04 DIAGNOSIS — Y92002 Bathroom of unspecified non-institutional (private) residence single-family (private) house as the place of occurrence of the external cause: Secondary | ICD-10-CM | POA: Diagnosis not present

## 2020-11-04 DIAGNOSIS — W01198A Fall on same level from slipping, tripping and stumbling with subsequent striking against other object, initial encounter: Secondary | ICD-10-CM | POA: Diagnosis not present

## 2020-11-04 DIAGNOSIS — R079 Chest pain, unspecified: Secondary | ICD-10-CM | POA: Diagnosis not present

## 2020-11-04 DIAGNOSIS — S0990XA Unspecified injury of head, initial encounter: Secondary | ICD-10-CM | POA: Diagnosis not present

## 2020-11-04 DIAGNOSIS — W19XXXA Unspecified fall, initial encounter: Secondary | ICD-10-CM

## 2020-11-04 DIAGNOSIS — M25531 Pain in right wrist: Secondary | ICD-10-CM | POA: Diagnosis not present

## 2020-11-04 DIAGNOSIS — R519 Headache, unspecified: Secondary | ICD-10-CM | POA: Diagnosis present

## 2020-11-04 MED ORDER — TETANUS-DIPHTH-ACELL PERTUSSIS 5-2.5-18.5 LF-MCG/0.5 IM SUSY
0.5000 mL | PREFILLED_SYRINGE | Freq: Once | INTRAMUSCULAR | Status: AC
  Administered 2020-11-04: 0.5 mL via INTRAMUSCULAR
  Filled 2020-11-04: qty 0.5

## 2020-11-04 MED ORDER — ACETAMINOPHEN 500 MG PO TABS
1000.0000 mg | ORAL_TABLET | Freq: Once | ORAL | Status: AC
  Administered 2020-11-04: 1000 mg via ORAL
  Filled 2020-11-04: qty 2

## 2020-11-04 NOTE — Discharge Instructions (Addendum)
Your imaging today was overall reassuring, head scan and x-rays look good.  You can use wrist brace to help with pain, but if wrist pain is not improving it is very important that you follow-up closely with your PCP for a repeat x-ray as sometimes you can have a fracture in the scaphoid bone that does not show up immediately on x-rays.  Give yourself some time to rest, if you continue to have headaches or develop worsening headache, vision changes, vomiting, numbness or weakness you should return for reevaluation.

## 2020-11-04 NOTE — ED Provider Notes (Signed)
[facility] EMERGENC[facility]PARTMENT [facility]   CSN: [id number] Arrival date & time: 11/04/20  0848     History Chief Complaint  Patient presents with  . Fall    Brenda Zimmerman is a 54 y.o. female.  Brenda Zimmerman is a 54 y.o. female who is otherwise healthy, presents to the ED after a fall.  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  She reports she hit the right side of her head and her ear on the door, her mass caused a small cut to her ear.  She did not have loss of consciousness but did feel a bit dizzy and has had a headache ever since the fall.  She reports that she fell forward to the ground but caught herself on her outstretched right arm and is having some pain at her wrist and her shoulder, no pain in the forearm or elbow.  No obvious deformity.  She also reports some pain over her right sided back and low back.  Denies any pain over her hips or lower extremities.  No anterior chest pain or abdominal pain and no shortness of breath.  No neck pain.  No nausea or vomiting, no visual changes.  No numbness tingling or weakness in her extremities.  She has not taken anything for pain prior to arrival.  Unsure of last tetanus vaccination.        No past medical history on file.  There are no problems to display for this patient.   No past surgical history on file.   OB History    Gravida  5   Para      Term      Preterm      AB      Living  5     SAB      IAB      Ectopic      Multiple      Live Births              No family history on file.  Social History   Tobacco Use  . Smoking status: Never Smoker  . Smokeless tobacco: Never Used  Vaping Use  . Vaping Use: Never used  Substance Use Topics  . Alcohol use: No  . Drug  use: No    Home Medications Prior to Admission medications   Medication Sig Start Date End Date Taking? Authorizing Provider  acetaminophen (TYLENOL) 325 MG tablet Take by mouth as needed for headache.    [provider]  CVS EAR DROPS 6.5 % OTIC solution Place 4 drops into both ears 2 (two) times daily. 11/18/17   [provider]  CVS OYSTER SHELL CALCIUM+VIT D 500-125 MG-UNIT TABS Take 1 tablet by mouth daily. 11/18/17   [provider]  naproxen (NAPROSYN) 250 MG tablet Take 1 tablet (250 mg total) by mouth 2 (two) times daily with a meal. Patient not taking: Reported on 06/02/2018 03/05/17   Hayden RasmussenMabe, David, NP  omeprazole (PRILOSEC) 20 MG capsule Take 20 mg by mouth 2 (two) times daily. 04/05/18   [provider]  ondansetron (ZOFRAN) 4 MG tablet Take 1 tablet (4 mg total) by mouth every 8 (eight) hours as needed for nausea or vomiting. Patient not taking: Reported  on 06/02/2018 02/11/18   Jacalyn Lefevre, MD  pantoprazole (PROTONIX) 40 MG tablet TAKE 1 TABLET BY MOUTH EVERY DAY IN THE MORNING 05/12/18   [provider]  triamcinolone cream (KENALOG) 0.1 % APPLY TO AFFECTED AREA TWICE A DAY AS NEEDED 05/12/18   [provider]  Vitamin D, Ergocalciferol, (DRISDOL) 50000 units CAPS capsule Take 50,000 Units by mouth once a week. 01/10/18   [provider]    Allergies    Patient has no known allergies.  Review of Systems   Review of Systems  Constitutional: Negative for chills and fever.  HENT: Negative for congestion, rhinorrhea and sore throat.   Eyes: Negative for visual disturbance.  Respiratory: Negative for cough and shortness of breath.   Cardiovascular: Negative for chest pain.  Gastrointestinal: Negative for nausea and vomiting.  Genitourinary: Negative for dysuria, flank pain and frequency.  Musculoskeletal: Positive for arthralgias and back pain. Negative for joint swelling and neck pain.  Skin: Negative for color change  and rash.  Neurological: Positive for headaches. Negative for dizziness, syncope, weakness, light-headedness and numbness.    Physical Exam Updated Vital Signs BP 134/74 (BP Location: Right Arm)   Pulse 87   Temp 98.2 F (36.8 C) (Oral)   Resp 15   Ht 5' (1.524 m)   Wt 66.7 kg   SpO2 100%   BMI 28.71 kg/m   Physical Exam Vitals and nursing note reviewed.  Constitutional:      General: She is not in acute distress.    Appearance: Normal appearance. She is well-developed and well-nourished. She is not diaphoretic.  HENT:     Head: Normocephalic.     Comments: Tenderness to palpation over the temporal region of the right side of the scalp with small hematoma.  No palpable step-off or deformity, negative battle sign    Right Ear: No hemotympanum.     Ears:      Comments: There is a very small superficial laceration to the auricle on the right ear, no hematoma, no involvement of cartilage, the rest of the auricle is intact, no hemotympanum or CSF otorrhea    Mouth/Throat:     Mouth: Oropharynx is clear and moist.  Eyes:     General:        Right eye: No discharge.        Left eye: No discharge.     Extraocular Movements: EOM normal.     Pupils: Pupils are equal, round, and reactive to light.  Neck:     Comments: No midline C-spine tenderness, normal range of motion, no step-off or deformity Cardiovascular:     Rate and Rhythm: Normal rate and regular rhythm.     Pulses: Intact distal pulses.     Heart sounds: Normal heart sounds. No murmur heard. No friction rub.  Pulmonary:     Effort: Pulmonary effort is normal. No respiratory distress.     Breath sounds: Normal breath sounds. No wheezing or rales.     Comments: There is some mild chest wall tenderness over the right posterior lateral ribs with no overlying skin changes, no palpable deformity or crepitus, breath sounds present and equal bilaterally Chest:     Chest wall: Tenderness present.  Abdominal:     General:  Bowel sounds are normal. There is no distension.     Palpations: Abdomen is soft. There is no mass.     Tenderness: There is no abdominal tenderness. There is no guarding.     Comments:  No abdominal tenderness, no CVA tenderness  Musculoskeletal:        General: No deformity or edema.     Cervical back: Neck supple.     Comments: There is some tenderness over the thoracic back musculature on the right but no midline tenderness, there is some midline lumbar spine tenderness without palpable step-off or deformity Tenderness over the right wrist, with point tenderness over the scaphoid but no significant bony deformity or swelling, no ecchymosis.  2+ radial pulse and good cap refill, normal range of motion and normal sensation There is also some tenderness at the right shoulder no tenderness throughout the forearm, elbow or upper arm.  No significant deformity over the shoulder. All other joints supple and easily movable, all compartments soft  Skin:    General: Skin is warm and dry.     Capillary Refill: Capillary refill takes less than 2 seconds.  Neurological:     Mental Status: She is alert.     Coordination: Coordination normal.     Comments: Speech is clear, able to follow commands CN III-XII intact Normal strength in upper and lower extremities bilaterally including dorsiflexion and plantar flexion, strong and equal grip strength Sensation normal to light and sharp touch Moves extremities without ataxia, coordination intact  Psychiatric:        Mood and Affect: Mood normal.        Behavior: Behavior normal.     ED Results / Procedures / Treatments   Labs (all labs ordered are listed, but only abnormal results are displayed) Labs Reviewed - No data to display  EKG None  Radiology DG Ribs Unilateral W/Chest Right  Result Date: 11/04/2020 CLINICAL DATA:  Pain following fall EXAM: RIGHT RIBS AND CHEST - 3+ VIEW COMPARISON:  Chest and ribs August 07, 2010 FINDINGS: Frontal  chest as well as oblique and cone-down rib images were obtained. Lungs are clear. Heart size and pulmonary vascularity are normal. No adenopathy. No pneumothorax or pleural effusion. No evident rib fracture. IMPRESSION: No evident rib fracture.  Lungs are clear. Electronically Signed   By: Bretta Bang III M.D.   On: 11/04/2020 14:20   DG Lumbar Spine Complete  Result Date: 11/04/2020 CLINICAL DATA:  Pain following fall EXAM: LUMBAR SPINE - COMPLETE 4+ VIEW COMPARISON:  None. FINDINGS: Frontal, lateral, spot lumbosacral lateral, and bilateral oblique views were obtained. There are 5 non-rib-bearing lumbar type vertebral bodies. There is no fracture or spondylolisthesis. The disc spaces appear normal. There is no appreciable facet arthropathy. IMPRESSION: No fracture or spondylolisthesis. No appreciable arthropathic change. Electronically Signed   By: Bretta Bang III M.D.   On: 11/04/2020 14:21   DG Shoulder Right  Result Date: 11/04/2020 CLINICAL DATA:  Pain following fall EXAM: RIGHT SHOULDER - 2+ VIEW COMPARISON:  None. FINDINGS: Frontal, oblique, and lateral views were obtained. There is no fracture or dislocation. Joint spaces appear normal. No erosive change. Tiny calcification noted immediately adjacent to the lateral upper right clavicle. Visualized right lung clear. IMPRESSION: No fracture or dislocation. No appreciable joint space narrowing. Probable tiny focus of tendinosis along the superior, lateral aspect of the clavicle. Electronically Signed   By: Bretta Bang III M.D.   On: 11/04/2020 14:18   DG Wrist Complete Right  Result Date: 11/04/2020 CLINICAL DATA:  Fall.  Rib pain. EXAM: RIGHT WRIST - COMPLETE 3+ VIEW COMPARISON:  No recent. FINDINGS: No acute bony or joint abnormality. No evidence of fracture or dislocation. No radiopaque foreign body. IMPRESSION:  No acute abnormality. Electronically Signed   By: Maisie Fus  Register   On: 11/04/2020 14:20   CT Head Wo  Contrast  Result Date: 11/04/2020 CLINICAL DATA:  Head trauma.  Blunt trauma EXAM: CT HEAD WITHOUT CONTRAST TECHNIQUE: Contiguous axial images were obtained from the base of the skull through the vertex without intravenous contrast. COMPARISON:  08/31/2007 FINDINGS: Brain: No acute intracranial hemorrhage. No focal mass lesion. No CT evidence of acute infarction. No midline shift or mass effect. No hydrocephalus. Basilar cisterns are patent. Vascular: No hyperdense vessel or unexpected calcification. Skull: Normal. Negative for fracture or focal lesion. Sinuses/Orbits: Paranasal sinuses and mastoid air cells are clear. Orbits are clear. Other: None. IMPRESSION: Normal head CT. Electronically Signed   By: Genevive Bi M.D.   On: 11/04/2020 10:28    Procedures Procedures (including critical care time)  Medications Ordered in ED Medications  Tdap (BOOSTRIX) injection 0.5 mL (0.5 mLs Intramuscular Given 11/04/20 1418)  acetaminophen (TYLENOL) tablet 1,000 mg (1,000 mg Oral Given 11/04/20 1418)    ED Course  I have reviewed the triage vital signs and the nursing notes.  Pertinent labs & imaging results that were available during my care of the patient were reviewed by me and considered in my medical decision making (see chart for details).    MDM Rules/Calculators/A&P                         54 year old female presents after mechanical fall while at work striking the right side of her head and injuring her wrist, also reporting pain over her shoulder as well as her right back and ribs.  No injury to her lower extremities.  She does have a very small superficial laceration to the top of the right auricle without cartilage involvement, will not require sutured repair, but tetanus updated.  Head CT ordered from triage which is unremarkable fortunately.  She also has some right wrist pain with point tenderness over the scaphoid without significant deformity, will get wrist x-rays as well as  shoulder films, right rib films and lumbar spine films, no midline C-spine tenderness no evidence of trauma to other extremities.  No anterior chest wall tenderness or abdominal tenderness.  I have personally reviewed patient's imaging and agree with radiology findings.  Again head CT is clear, and x-rays of the right wrist and shoulder show no acute bony abnormality.  X-rays of the right ribs and lumbar spine are also unremarkable.  Discussed reassuring imaging with the patient, since she does have focal scaphoid tenderness she is placed in a Velcro thumb spica splint and will follow up with her PCP if wrist pain is not improving.  Discussed symptomatic treatment at home and return precautions.  She expresses understanding and agreement.  Discharged home in good condition.   Final Clinical Impression(s) / ED Diagnoses Final diagnoses:  Fall, initial encounter  Injury of head, initial encounter  Right wrist pain  Right-sided chest pain    Rx / DC Orders ED Discharge Orders    None       Legrand Rams 11/04/20 1520    Gwyneth Sprout, MD 11/04/20 1541

## 2020-11-04 NOTE — Progress Notes (Signed)
Orthopedic Tech Progress Note Patient Details:  Brenda Zimmerman 21-Jun-1966 [id number]  Ortho Devices Type of Ortho Device: Thumb velcro splint Ortho Device/Splint Location: Right Upper Extremity Ortho Device/Splint Interventions: Ordered,Application,Adjustment   Post Interventions Patient Tolerated: Well Instructions Provided: Adjustment of device,Care of device,Poper ambulation with device   [name] 11/04/2020, 2:52 PM

## 2020-11-04 NOTE — ED Triage Notes (Signed)
Patient stated she fell and hit her right side towards the door. C/o head, arm and side pain.

## 2021-01-21 ENCOUNTER — Ambulatory Visit (INDEPENDENT_AMBULATORY_CARE_PROVIDER_SITE_OTHER): Payer: 59 | Admitting: Podiatry

## 2021-01-21 ENCOUNTER — Other Ambulatory Visit: Payer: Self-pay

## 2021-01-21 ENCOUNTER — Ambulatory Visit: Payer: 59

## 2021-01-21 DIAGNOSIS — M25561 Pain in right knee: Secondary | ICD-10-CM | POA: Diagnosis not present

## 2021-01-21 DIAGNOSIS — M2141 Flat foot [pes planus] (acquired), right foot: Secondary | ICD-10-CM

## 2021-01-21 DIAGNOSIS — M7751 Other enthesopathy of right foot: Secondary | ICD-10-CM

## 2021-01-21 NOTE — Progress Notes (Signed)
  Subjective:  Patient ID: Brenda Zimmerman, female    DOB: Dec 03, 1965,  MRN: [id number]  Chief Complaint  Patient presents with  . Ankle Pain    Right ankle pain - ankle has been hurting for weeks now - pt pain is a 9-8/10 pt states her knee is the problem    55 y.o. female presents with the above complaint. History confirmed with patient.   Objective:  Physical Exam: warm, good capillary refill, no trophic changes or ulcerative lesions, normal DP and PT pulses and normal sensory exam. Right Foot: pes planus, mild POP right sinus tarsi.  No images are attached to the encounter.  Radiographs: X-ray of the right foot: no fracture, dislocation, swelling or degenerative changes noted Assessment:   1. Tendonitis of ankle, right   2. Acquired pes planus, right   3. Acute pain of right knee      Plan:  Patient was evaluated and treated and all questions answered.  Pes planus -Patient stated that the pain was actually from her knee and she is not currently having pain in her ankle.  She does not wish to discuss the ankle pain even though she does have some objective signs of pain.  I asked the patient and her translator if she were sure and she said yes.  Would like referral to orthopedic surgery for knee eval. Referral placed.  No follow-ups on file.

## 2021-09-05 ENCOUNTER — Other Ambulatory Visit: Payer: Self-pay

## 2021-09-05 ENCOUNTER — Ambulatory Visit (INDEPENDENT_AMBULATORY_CARE_PROVIDER_SITE_OTHER): Payer: 59 | Admitting: Podiatry

## 2021-09-05 DIAGNOSIS — M722 Plantar fascial fibromatosis: Secondary | ICD-10-CM | POA: Diagnosis not present

## 2021-09-11 ENCOUNTER — Encounter: Payer: Self-pay | Admitting: Podiatry

## 2021-09-11 NOTE — Progress Notes (Signed)
Subjective:  Patient ID: Brenda Zimmerman, female    DOB: 09/27/1966,  MRN: [id number]  Chief Complaint  Patient presents with   Foot Pain    Left foot pain in the arch     55 y.o. female presents with the above complaint.  Patient presents with pain to the left midfoot plantar fascial.  Patient states pain in the middle of the foot hurts with ambulation.  She states that has progressed to gotten worse.  She does not want to do x-ray if she can avoid it.  She wanted get it evaluated.  She would like to discuss all treatment options.  Her pain scale is 7 out of 10 hurts with ambulation of her's with post static dyskinesia type of symptoms.   Review of Systems: Negative except as noted in the HPI. Denies N/V/F/Ch.  No past medical history on file.  Current Outpatient Medications:    acetaminophen (TYLENOL) 325 MG tablet, Take by mouth as needed for headache., Disp: , Rfl:    [facility] EAR DROPS 6.5 % OTIC solution, Place 4 drops into both ears 2 (two) times daily., Disp: , Rfl: 0   [facility] OYSTER SHELL CALCIUM+VIT D 500-125 MG-UNIT TABS, Take 1 tablet by mouth daily., Disp: , Rfl: 2   naproxen (NAPROSYN) 250 MG tablet, Take 1 tablet (250 mg total) by mouth 2 (two) times daily with a meal. (Patient not taking: Reported on 06/02/2018), Disp: 20 tablet, Rfl: 0   omeprazole (PRILOSEC) 20 MG capsule, Take 20 mg by mouth 2 (two) times daily., Disp: , Rfl: 2   ondansetron (ZOFRAN) 4 MG tablet, Take 1 tablet (4 mg total) by mouth every 8 (eight) hours as needed for nausea or vomiting. (Patient not taking: Reported on 06/02/2018), Disp: 4 tablet, Rfl: 0   pantoprazole (PROTONIX) 40 MG tablet, TAKE 1 TABLET BY MOUTH EVERY DAY IN THE MORNING, Disp: , Rfl: 2   triamcinolone cream (KENALOG) 0.1 %, APPLY TO AFFECTED AREA TWICE A DAY AS NEEDED, Disp: , Rfl: 2   Vitamin D, Ergocalciferol, (DRISDOL) 50000 units CAPS capsule, Take 50,000 Units by mouth once a week., Disp: , Rfl: 5  Social History   Tobacco Use  Smoking  Status Never  Smokeless Tobacco Never    No Known Allergies Objective:  There were no vitals filed for this visit. There is no height or weight on file to calculate BMI. Constitutional Well developed. Well nourished.  Vascular Dorsalis pedis pulses palpable bilaterally. Posterior tibial pulses palpable bilaterally. Capillary refill normal to all digits.  No cyanosis or clubbing noted. Pedal hair growth normal.  Neurologic Normal speech. Oriented to person, place, and time. Epicritic sensation to light touch grossly present bilaterally.  Dermatologic Nails well groomed and normal in appearance. No open wounds. No skin lesions.  Orthopedic: Normal joint ROM without pain or crepitus bilaterally. No visible deformities. Tender to palpation at the left midfoot plantar fascia No pain with calcaneal squeeze left. Ankle ROM diminished range of motion left. Silfverskiold Test: positive left.   Radiographs: None  Assessment:   1. Plantar fasciitis, left    Plan:  Patient was evaluated and treated and all questions answered.  Plantar Fasciitis, left midfoot - XR reviewed as above.  - Educated on icing and stretching. Instructions given.  - Injection delivered to the plantar fascia as below. - DME: Plantar Fascial Brace - Pharmacologic management: None  Procedure: Injection Tendon/Ligament Location: Left plantar fascia at the glabrous junction; medial approach. Skin Prep: alcohol Injectate: 0.5 cc  0.5% marcaine plain, 0.5 cc of 1% Lidocaine, 0.5 cc kenalog 10. Disposition: Patient tolerated procedure well. Injection site dressed with a band-aid.  No follow-ups on file.

## 2021-10-03 ENCOUNTER — Ambulatory Visit: Payer: 59 | Admitting: Podiatry

## 2021-10-22 ENCOUNTER — Other Ambulatory Visit: Payer: Self-pay

## 2021-10-22 ENCOUNTER — Ambulatory Visit (INDEPENDENT_AMBULATORY_CARE_PROVIDER_SITE_OTHER): Payer: 59 | Admitting: Podiatry

## 2021-10-22 DIAGNOSIS — M722 Plantar fascial fibromatosis: Secondary | ICD-10-CM

## 2021-10-28 ENCOUNTER — Encounter: Payer: Self-pay | Admitting: Podiatry

## 2021-10-28 NOTE — Progress Notes (Signed)
°  Subjective:  Patient ID: Brenda Zimmerman, female    DOB: [birth year],  MRN: [id number]  Chief Complaint  Patient presents with   Plantar Fasciitis    Pt stated that she is still having pain     55 y.o. female presents with the above complaint.  Patient presents with pain to the left midfoot plantar fascial.  She states the pain is about the same the injection did not help.  Hurts with ambulation.  She would like to discuss next treatment plan the bracing did not help either.   Review of Systems: Negative except as noted in the HPI. Denies N/V/F/Ch.  No past medical history on file.  Current Outpatient Medications:    acetaminophen (TYLENOL) 325 MG tablet, Take by mouth as needed for headache., Disp: , Rfl:    [facility] EAR DROPS 6.5 % OTIC solution, Place 4 drops into both ears 2 (two) times daily., Disp: , Rfl: 0   [facility] OYSTER SHELL CALCIUM+VIT D 500-125 MG-UNIT TABS, Take 1 tablet by mouth daily., Disp: , Rfl: 2   naproxen (NAPROSYN) 250 MG tablet, Take 1 tablet (250 mg total) by mouth 2 (two) times daily with a meal. (Patient not taking: Reported on 06/02/2018), Disp: 20 tablet, Rfl: 0   omeprazole (PRILOSEC) 20 MG capsule, Take 20 mg by mouth 2 (two) times daily., Disp: , Rfl: 2   ondansetron (ZOFRAN) 4 MG tablet, Take 1 tablet (4 mg total) by mouth every 8 (eight) hours as needed for nausea or vomiting. (Patient not taking: Reported on 06/02/2018), Disp: 4 tablet, Rfl: 0   pantoprazole (PROTONIX) 40 MG tablet, TAKE 1 TABLET BY MOUTH EVERY DAY IN THE MORNING, Disp: , Rfl: 2   triamcinolone cream (KENALOG) 0.1 %, APPLY TO AFFECTED AREA TWICE A DAY AS NEEDED, Disp: , Rfl: 2   Vitamin D, Ergocalciferol, (DRISDOL) 50000 units CAPS capsule, Take 50,000 Units by mouth once a week., Disp: , Rfl: 5  Social History   Tobacco Use  Smoking Status Never  Smokeless Tobacco Never    No Known Allergies Objective:  There were no vitals filed for this visit. There is no height or weight on file to  calculate BMI. Constitutional Well developed. Well nourished.  Vascular Dorsalis pedis pulses palpable bilaterally. Posterior tibial pulses palpable bilaterally. Capillary refill normal to all digits.  No cyanosis or clubbing noted. Pedal hair growth normal.  Neurologic Normal speech. Oriented to person, place, and time. Epicritic sensation to light touch grossly present bilaterally.  Dermatologic Nails well groomed and normal in appearance. No open wounds. No skin lesions.  Orthopedic: Normal joint ROM without pain or crepitus bilaterally. No visible deformities. Tender to palpation at the left midfoot plantar fascia No pain with calcaneal squeeze left. Ankle ROM diminished range of motion left. Silfverskiold Test: positive left.   Radiographs: None  Assessment:   1. Plantar fasciitis, left     Plan:  Patient was evaluated and treated and all questions answered.  Plantar Fasciitis, left midfoot - XR reviewed as above.  - Educated on icing and stretching. Instructions given.  -I will hold off on any further injection - DME: Plantar Fascial Brace cam boot immobilization - Pharmacologic management: None -I once again discussed cam boot immobilization which I believe she will benefit from.  I discussed bracing as well.  I discussed shoe gear modification.  No follow-ups on file.

## 2021-12-09 DIAGNOSIS — H52203 Unspecified astigmatism, bilateral: Secondary | ICD-10-CM | POA: Diagnosis not present

## 2021-12-09 DIAGNOSIS — H5203 Hypermetropia, bilateral: Secondary | ICD-10-CM | POA: Diagnosis not present

## 2021-12-09 DIAGNOSIS — H25813 Combined forms of age-related cataract, bilateral: Secondary | ICD-10-CM | POA: Diagnosis not present

## 2021-12-09 DIAGNOSIS — H524 Presbyopia: Secondary | ICD-10-CM | POA: Diagnosis not present

## 2021-12-10 ENCOUNTER — Ambulatory Visit: Payer: 59 | Admitting: Podiatry

## 2022-03-23 DIAGNOSIS — J302 Other seasonal allergic rhinitis: Secondary | ICD-10-CM | POA: Diagnosis not present

## 2022-03-23 DIAGNOSIS — K219 Gastro-esophageal reflux disease without esophagitis: Secondary | ICD-10-CM | POA: Diagnosis not present

## 2022-03-23 DIAGNOSIS — Z0000 Encounter for general adult medical examination without abnormal findings: Secondary | ICD-10-CM | POA: Diagnosis not present

## 2022-03-23 DIAGNOSIS — H6122 Impacted cerumen, left ear: Secondary | ICD-10-CM | POA: Diagnosis not present

## 2022-03-23 DIAGNOSIS — E559 Vitamin D deficiency, unspecified: Secondary | ICD-10-CM | POA: Diagnosis not present

## 2022-03-23 DIAGNOSIS — E7849 Other hyperlipidemia: Secondary | ICD-10-CM | POA: Diagnosis not present

## 2022-03-23 DIAGNOSIS — Z131 Encounter for screening for diabetes mellitus: Secondary | ICD-10-CM | POA: Diagnosis not present

## 2022-03-23 DIAGNOSIS — Z1239 Encounter for other screening for malignant neoplasm of breast: Secondary | ICD-10-CM | POA: Diagnosis not present

## 2022-08-19 IMAGING — CR DG WRIST COMPLETE 3+V*R*
4 series · 4 of 4 positions shown · non-contrast
Comparison: No recent.

CLINICAL DATA: Fall.  Rib pain.

EXAM:
RIGHT WRIST - COMPLETE 3+ VIEW

[wrist pa]
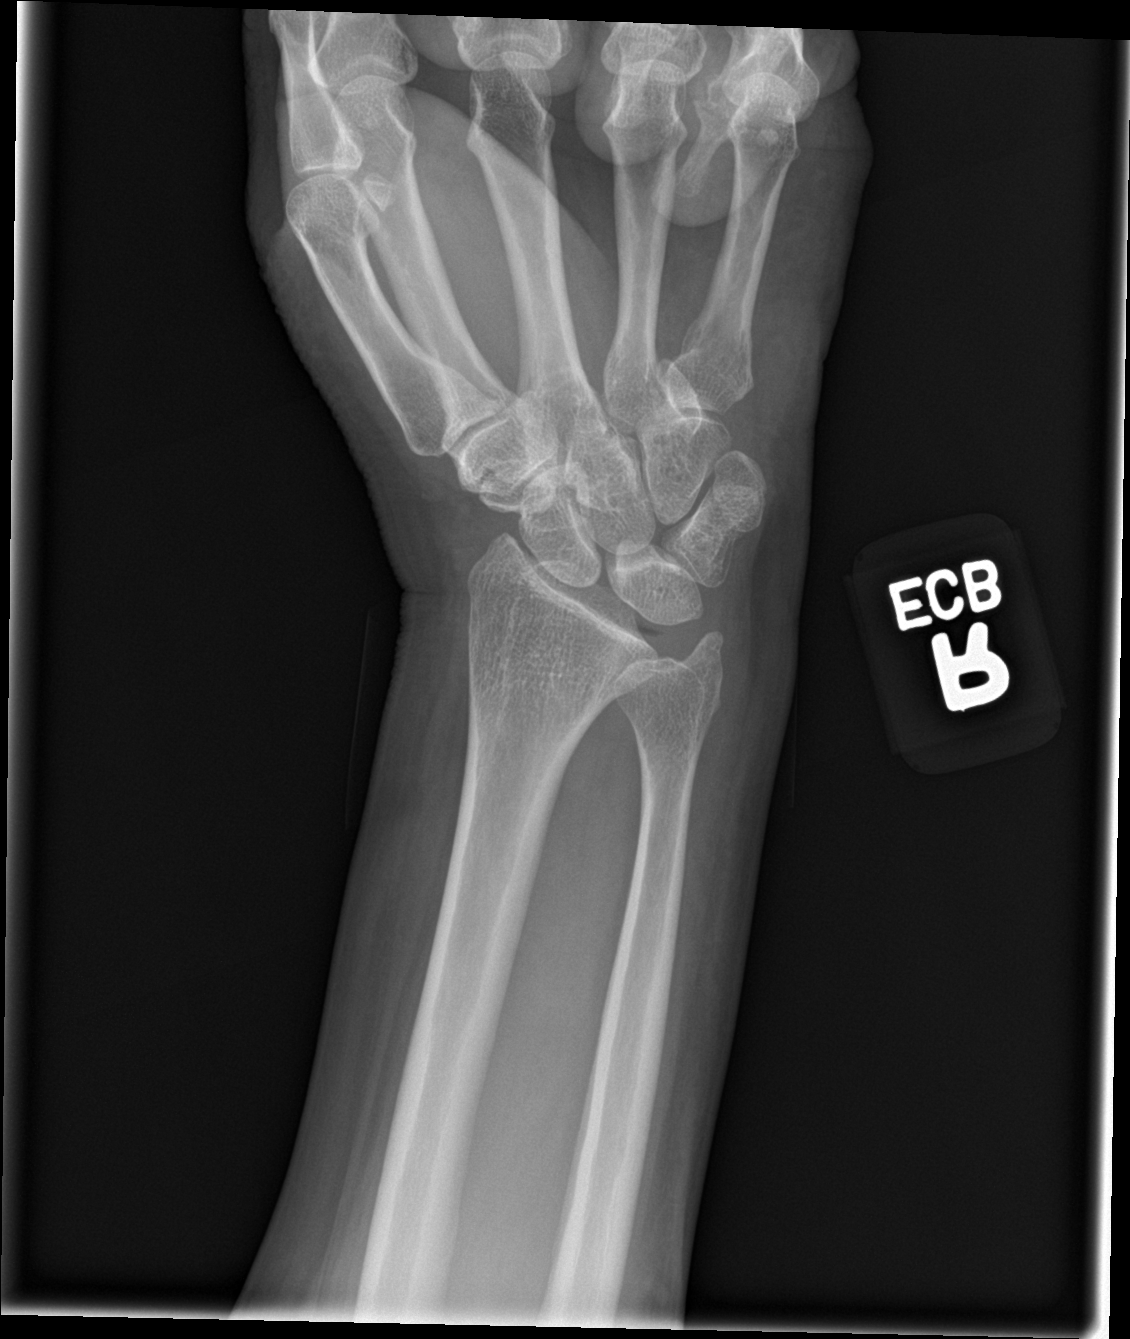

[wrist obl]
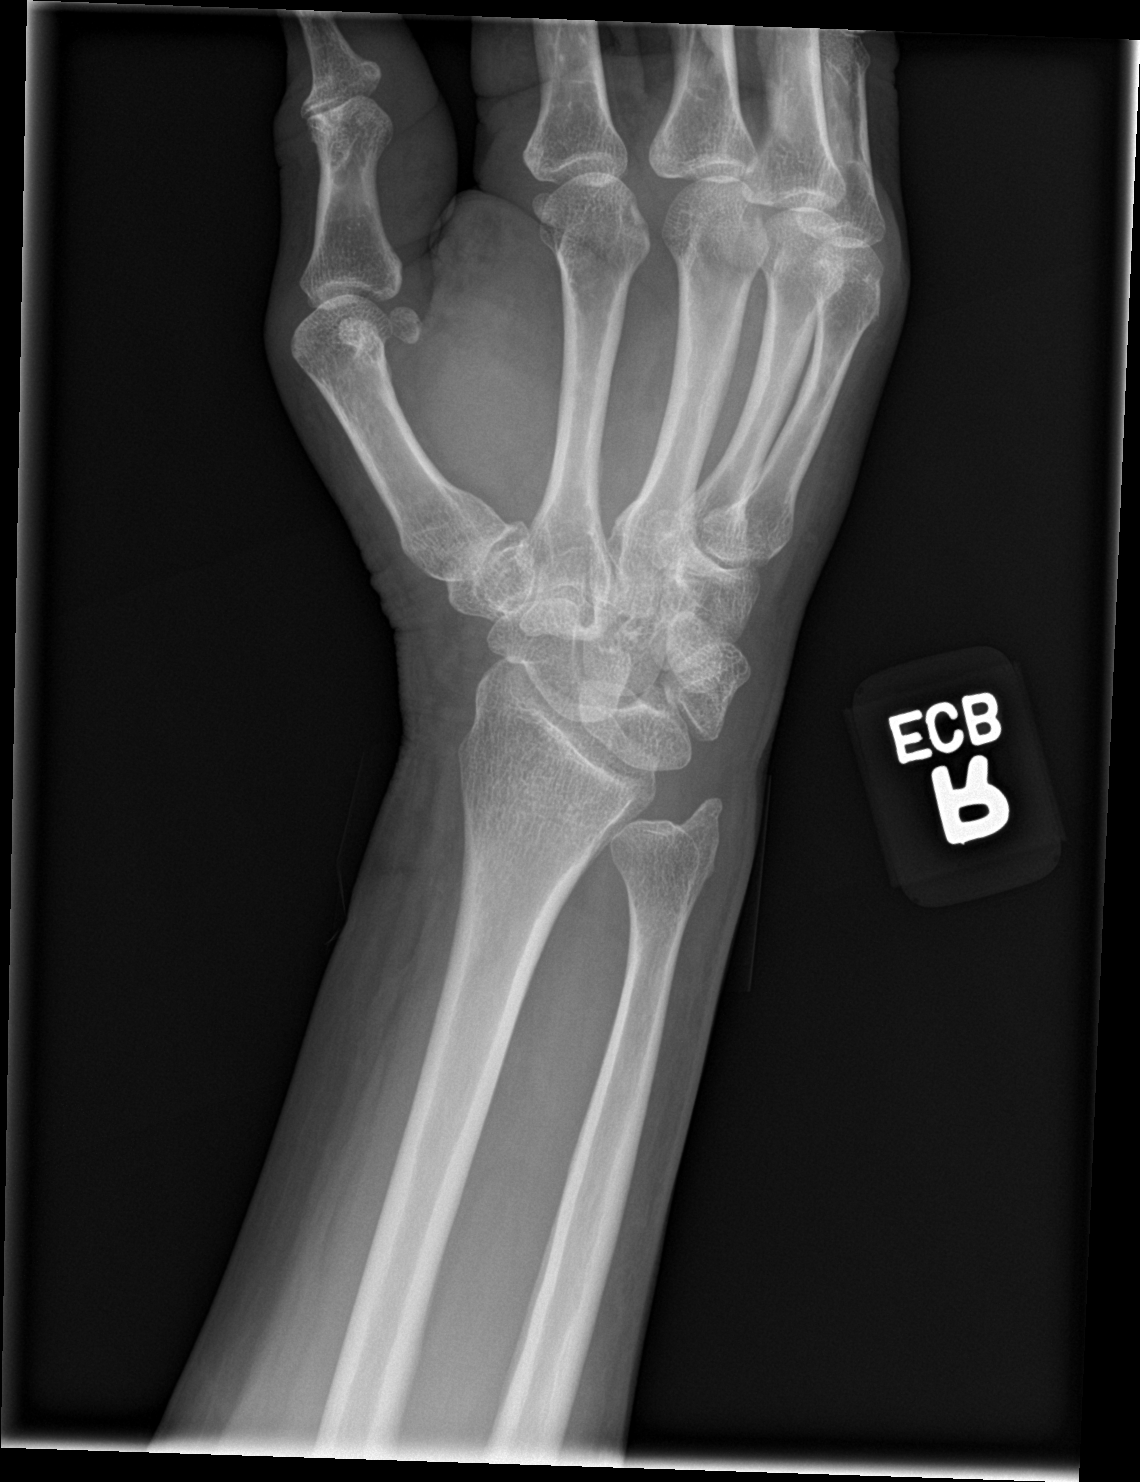

[wrist lat]
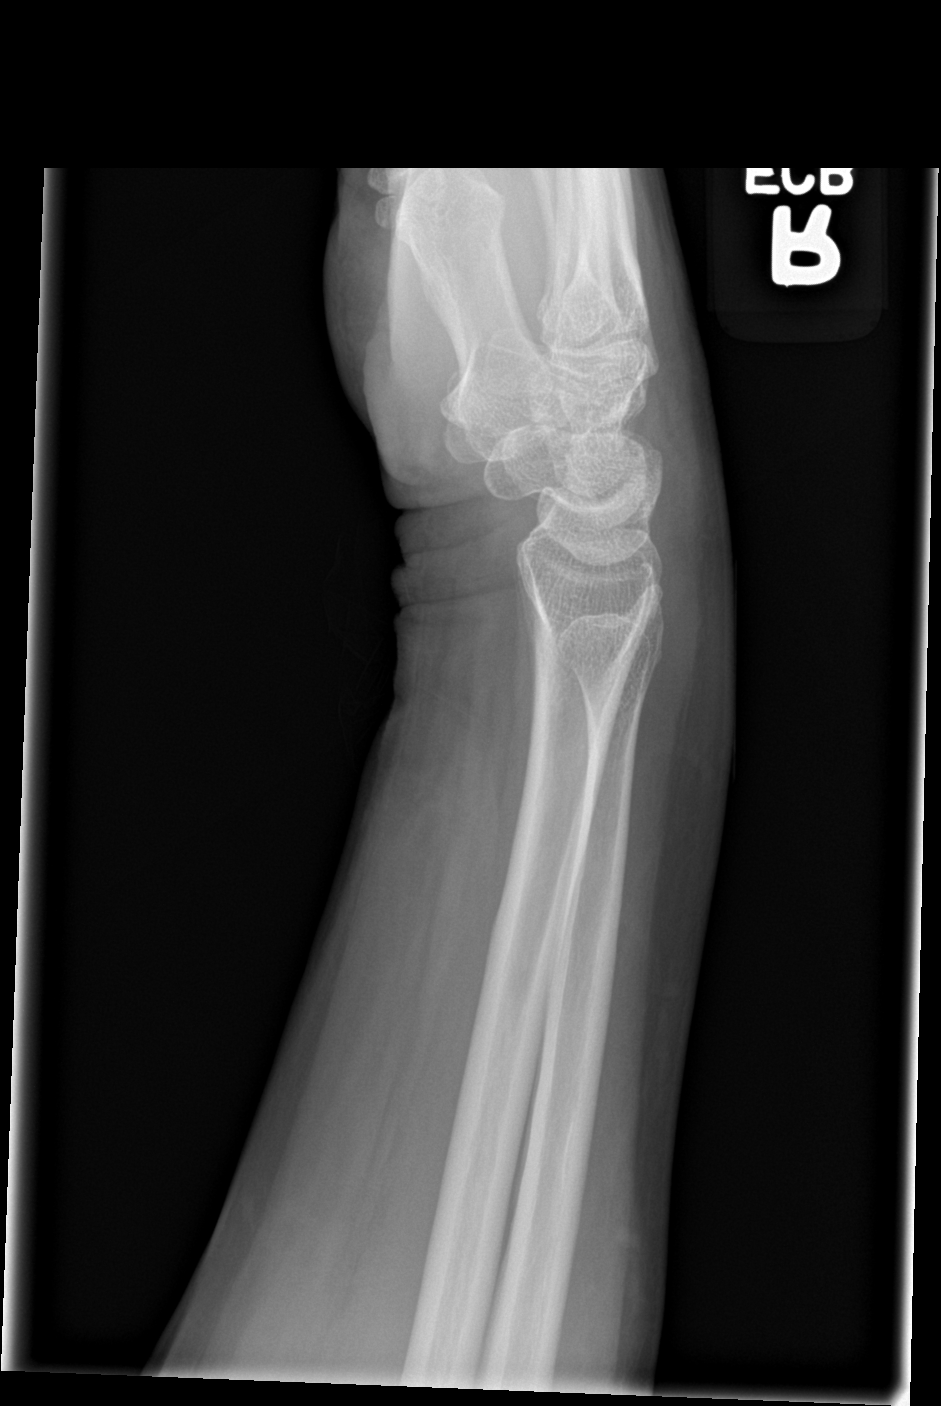

[wrist navicular]
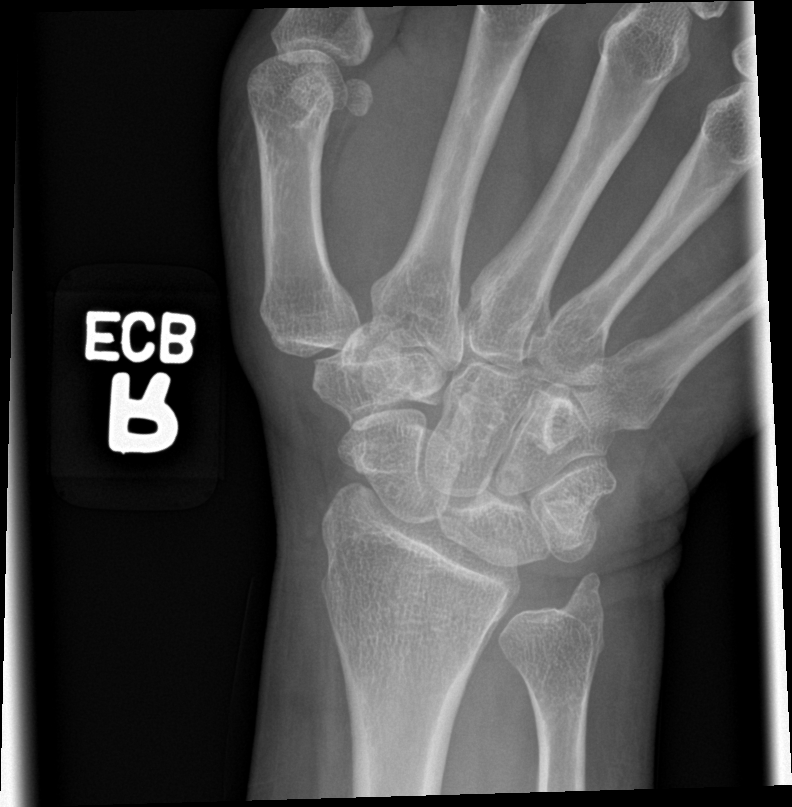

[4 of 4 positions shown; findings below may reference images not displayed]

FINDINGS: No acute bony or joint abnormality. No evidence of fracture or
dislocation. No radiopaque foreign body.
IMPRESSION: No acute abnormality.

## 2022-10-29 DIAGNOSIS — J302 Other seasonal allergic rhinitis: Secondary | ICD-10-CM | POA: Diagnosis not present

## 2022-10-29 DIAGNOSIS — E7849 Other hyperlipidemia: Secondary | ICD-10-CM | POA: Diagnosis not present

## 2022-10-29 DIAGNOSIS — M5459 Other low back pain: Secondary | ICD-10-CM | POA: Diagnosis not present

## 2022-10-29 DIAGNOSIS — K219 Gastro-esophageal reflux disease without esophagitis: Secondary | ICD-10-CM | POA: Diagnosis not present

## 2023-07-07 ENCOUNTER — Other Ambulatory Visit: Payer: Self-pay | Admitting: Internal Medicine

## 2023-07-07 DIAGNOSIS — E7849 Other hyperlipidemia: Secondary | ICD-10-CM | POA: Diagnosis not present

## 2023-07-07 DIAGNOSIS — Z0000 Encounter for general adult medical examination without abnormal findings: Secondary | ICD-10-CM | POA: Diagnosis not present

## 2023-07-07 DIAGNOSIS — E559 Vitamin D deficiency, unspecified: Secondary | ICD-10-CM | POA: Diagnosis not present

## 2023-07-08 DIAGNOSIS — I272 Pulmonary hypertension, unspecified: Secondary | ICD-10-CM | POA: Diagnosis not present

## 2023-07-08 LAB — VITAMIN D 25 HYDROXY (VIT D DEFICIENCY, FRACTURES): Vit D, 25-Hydroxy: 30 ng/mL (ref 30–100)

## 2023-07-08 LAB — COMPLETE METABOLIC PANEL WITH GFR
AG Ratio: 1.4 (calc) (ref 1.0–2.5)
ALT: 22 U/L (ref 6–29)
AST: 18 U/L (ref 10–35)
Albumin: 4.4 g/dL (ref 3.6–5.1)
Alkaline phosphatase (APISO): 100 U/L (ref 37–153)
BUN: 17 mg/dL (ref 7–25)
CO2: 20 mmol/L (ref 20–32)
Calcium: 9.6 mg/dL (ref 8.6–10.4)
Chloride: 103 mmol/L (ref 98–110)
Creat: 0.6 mg/dL (ref 0.50–1.03)
Globulin: 3.2 g/dL (ref 1.9–3.7)
Glucose, Bld: 96 mg/dL (ref 65–99)
Potassium: 4.1 mmol/L (ref 3.5–5.3)
Sodium: 135 mmol/L (ref 135–146)
Total Bilirubin: 0.3 mg/dL (ref 0.2–1.2)
Total Protein: 7.6 g/dL (ref 6.1–8.1)
eGFR: 103 mL/min/{1.73_m2} (ref >=60)

## 2023-07-08 LAB — CBC
HCT: 37.4 % (ref 35.0–45.0)
Hemoglobin: 12.5 g/dL (ref 11.7–15.5)
MCH: 28.2 pg (ref 27.0–33.0)
MCHC: 33.4 g/dL (ref 32.0–36.0)
MCV: 84.2 fL (ref 80.0–100.0)
MPV: 10.6 fL (ref 7.5–12.5)
Platelets: 290 10*3/uL (ref 140–400)
RBC: 4.44 10*6/uL (ref 3.80–5.10)
RDW: 13.1 % (ref 11.0–15.0)
WBC: 8.1 10*3/uL (ref 3.8–10.8)

## 2023-07-08 LAB — LIPID PANEL
Cholesterol: 299 mg/dL — ABNORMAL HIGH (ref <200)
HDL: 42 mg/dL — ABNORMAL LOW (ref >=50)
LDL Cholesterol (Calc): 205 mg/dL — ABNORMAL HIGH
Non-HDL Cholesterol (Calc): 257 mg/dL — ABNORMAL HIGH (ref <130)
Total CHOL/HDL Ratio: 7.1 (calc) — ABNORMAL HIGH (ref <5.0)
Triglycerides: 307 mg/dL — ABNORMAL HIGH (ref <150)

## 2023-09-30 ENCOUNTER — Other Ambulatory Visit (HOSPITAL_COMMUNITY): Payer: Self-pay

## 2023-09-30 MED ORDER — INFLUENZA VIRUS VACC SPLIT PF (FLUZONE) 0.5 ML IM SUSY
0.5000 mL | PREFILLED_SYRINGE | Freq: Once | INTRAMUSCULAR | 0 refills | Status: AC
  Filled 2023-09-30: qty 0.5, 1d supply, fill #0
